# Patient Record
Sex: Female | Born: 1965 | Race: Black or African American | Hispanic: No | Marital: Single | Smoking: Never smoker
Health system: Southern US, Community
[De-identification: ages and names within clinical notes are randomized; demographics above are authoritative.]

## PROBLEM LIST (undated history)

## (undated) DIAGNOSIS — E119 Type 2 diabetes mellitus without complications: Secondary | ICD-10-CM

## (undated) DIAGNOSIS — E785 Hyperlipidemia, unspecified: Secondary | ICD-10-CM

## (undated) HISTORY — DX: Hyperlipidemia, unspecified: E78.5

## (undated) HISTORY — DX: Type 2 diabetes mellitus without complications: E11.9

## (undated) HISTORY — PX: NO PAST SURGERIES: SHX2092

---

## 1998-03-10 ENCOUNTER — Emergency Department (HOSPITAL_COMMUNITY): Admission: EM | Admit: 1998-03-10 | Discharge: 1998-03-10 | Payer: Self-pay | Admitting: Emergency Medicine

## 2004-03-09 ENCOUNTER — Emergency Department (HOSPITAL_COMMUNITY): Admission: EM | Admit: 2004-03-09 | Discharge: 2004-03-09 | Payer: Self-pay | Admitting: Emergency Medicine

## 2004-05-12 ENCOUNTER — Encounter: Admission: RE | Admit: 2004-05-12 | Discharge: 2004-08-10 | Payer: Self-pay | Admitting: Orthopedic Surgery

## 2004-08-18 ENCOUNTER — Encounter: Admission: RE | Admit: 2004-08-18 | Discharge: 2004-10-22 | Payer: Self-pay | Admitting: Orthopedic Surgery

## 2005-01-13 ENCOUNTER — Encounter: Admission: RE | Admit: 2005-01-13 | Discharge: 2005-02-05 | Payer: Self-pay | Admitting: Orthopedic Surgery

## 2005-08-26 ENCOUNTER — Emergency Department (HOSPITAL_COMMUNITY): Admission: EM | Admit: 2005-08-26 | Discharge: 2005-08-26 | Payer: Self-pay | Admitting: Emergency Medicine

## 2006-04-25 ENCOUNTER — Encounter: Admission: RE | Admit: 2006-04-25 | Discharge: 2006-05-30 | Payer: Self-pay | Admitting: Orthopedic Surgery

## 2007-04-26 ENCOUNTER — Emergency Department (HOSPITAL_COMMUNITY): Admission: EM | Admit: 2007-04-26 | Discharge: 2007-04-27 | Payer: Self-pay | Admitting: Emergency Medicine

## 2010-10-09 ENCOUNTER — Emergency Department (HOSPITAL_COMMUNITY)
Admission: EM | Admit: 2010-10-09 | Discharge: 2010-10-09 | Payer: Self-pay | Source: Home / Self Care | Admitting: Emergency Medicine

## 2010-10-13 ENCOUNTER — Inpatient Hospital Stay (HOSPITAL_COMMUNITY)
Admission: EM | Admit: 2010-10-13 | Discharge: 2010-10-17 | Payer: Self-pay | Source: Home / Self Care | Attending: Internal Medicine | Admitting: Internal Medicine

## 2010-10-14 LAB — BASIC METABOLIC PANEL
BUN: 8 mg/dL (ref 6–23)
CO2: 27 mEq/L (ref 19–32)
Calcium: 8.7 mg/dL (ref 8.4–10.5)
Chloride: 106 mEq/L (ref 96–112)
Creatinine, Ser: 0.88 mg/dL (ref 0.4–1.2)
GFR calc Af Amer: >60 mL/min (ref >60)
GFR calc non Af Amer: >60 mL/min (ref >60)
Glucose, Bld: 133 mg/dL — ABNORMAL HIGH (ref 70–99)
Potassium: 3.9 mEq/L (ref 3.5–5.1)
Sodium: 138 mEq/L (ref 135–145)

## 2010-10-14 LAB — HEMOGLOBIN A1C
Hgb A1c MFr Bld: 7.7 % — ABNORMAL HIGH (ref <5.7)
Mean Plasma Glucose: 174 mg/dL — ABNORMAL HIGH (ref <117)

## 2010-10-14 LAB — CBC
HCT: 39.2 % (ref 36.0–46.0)
Hemoglobin: 12.6 g/dL (ref 12.0–15.0)
MCH: 26.7 pg (ref 26.0–34.0)
MCHC: 32.1 g/dL (ref 30.0–36.0)
MCV: 83.1 fL (ref 78.0–100.0)
Platelets: 281 10*3/uL (ref 150–400)
RBC: 4.72 MIL/uL (ref 3.87–5.11)
RDW: 13.5 % (ref 11.5–15.5)
WBC: 5.9 10*3/uL (ref 4.0–10.5)

## 2010-10-14 LAB — DIFFERENTIAL
Basophils Absolute: 0 10*3/uL (ref 0.0–0.1)
Basophils Relative: 0 % (ref 0–1)
Eosinophils Absolute: 0.1 10*3/uL (ref 0.0–0.7)
Eosinophils Relative: 2 % (ref 0–5)
Lymphocytes Relative: 28 % (ref 12–46)
Lymphs Abs: 1.7 10*3/uL (ref 0.7–4.0)
Monocytes Absolute: 0.3 10*3/uL (ref 0.1–1.0)
Monocytes Relative: 5 % (ref 3–12)
Neutro Abs: 3.9 10*3/uL (ref 1.7–7.7)
Neutrophils Relative %: 65 % (ref 43–77)

## 2010-10-14 LAB — LIPID PANEL
Cholesterol: 144 mg/dL (ref 0–200)
HDL: 37 mg/dL — ABNORMAL LOW (ref >39)
LDL Cholesterol: 87 mg/dL (ref 0–99)
Total CHOL/HDL Ratio: 3.9 RATIO
Triglycerides: 99 mg/dL (ref <150)
VLDL: 20 mg/dL (ref 0–40)

## 2010-10-14 LAB — TSH: TSH: 0.982 u[IU]/mL (ref 0.350–4.500)

## 2010-10-15 LAB — GLUCOSE, CAPILLARY
Glucose-Capillary: 98 mg/dL (ref 70–99)
Glucose-Capillary: 114 mg/dL — ABNORMAL HIGH (ref 70–99)

## 2010-10-16 LAB — COMPREHENSIVE METABOLIC PANEL
ALT: 16 U/L (ref 0–35)
AST: 18 U/L (ref 0–37)
Albumin: 3.2 g/dL — ABNORMAL LOW (ref 3.5–5.2)
Alkaline Phosphatase: 44 U/L (ref 39–117)
BUN: 4 mg/dL — ABNORMAL LOW (ref 6–23)
CO2: 20 mEq/L (ref 19–32)
Calcium: 8.5 mg/dL (ref 8.4–10.5)
Chloride: 104 mEq/L (ref 96–112)
Creatinine, Ser: 0.7 mg/dL (ref 0.4–1.2)
GFR calc Af Amer: >60 mL/min (ref >60)
GFR calc non Af Amer: >60 mL/min (ref >60)
Glucose, Bld: 149 mg/dL — ABNORMAL HIGH (ref 70–99)
Potassium: 3.3 mEq/L — ABNORMAL LOW (ref 3.5–5.1)
Sodium: 136 mEq/L (ref 135–145)
Total Bilirubin: 0.4 mg/dL (ref 0.3–1.2)
Total Protein: 6.7 g/dL (ref 6.0–8.3)

## 2010-10-16 LAB — CBC
HCT: 40.6 % (ref 36.0–46.0)
Hemoglobin: 13.5 g/dL (ref 12.0–15.0)
MCH: 27.4 pg (ref 26.0–34.0)
MCHC: 33.3 g/dL (ref 30.0–36.0)
MCV: 82.5 fL (ref 78.0–100.0)
Platelets: 284 10*3/uL (ref 150–400)
RBC: 4.92 MIL/uL (ref 3.87–5.11)
RDW: 13.4 % (ref 11.5–15.5)
WBC: 5.1 10*3/uL (ref 4.0–10.5)

## 2010-10-16 LAB — GLUCOSE, CAPILLARY
Glucose-Capillary: 133 mg/dL — ABNORMAL HIGH (ref 70–99)
Glucose-Capillary: 80 mg/dL (ref 70–99)

## 2010-10-16 LAB — APTT: aPTT: 27 seconds (ref 24–37)

## 2010-10-16 LAB — PROTIME-INR
INR: 0.99 (ref 0.00–1.49)
Prothrombin Time: 13.3 seconds (ref 11.6–15.2)

## 2010-10-16 LAB — MAGNESIUM: Magnesium: 1.8 mg/dL (ref 1.5–2.5)

## 2010-10-26 LAB — GLUCOSE, CAPILLARY
Glucose-Capillary: 134 mg/dL — ABNORMAL HIGH (ref 70–99)
Glucose-Capillary: 128 mg/dL — ABNORMAL HIGH (ref 70–99)

## 2010-12-21 LAB — CBC
HCT: 40.2 % (ref 36.0–46.0)
HCT: 44 % (ref 36.0–46.0)
Hemoglobin: 14.7 g/dL (ref 12.0–15.0)
MCH: 26.6 pg (ref 26.0–34.0)
MCHC: 33.4 g/dL (ref 30.0–36.0)
MCV: 82.4 fL (ref 78.0–100.0)
RBC: 5.31 MIL/uL — ABNORMAL HIGH (ref 3.87–5.11)
RDW: 13.5 % (ref 11.5–15.5)
WBC: 7.3 10*3/uL (ref 4.0–10.5)

## 2010-12-21 LAB — COMPREHENSIVE METABOLIC PANEL
ALT: 16 U/L (ref 0–35)
ALT: 19 U/L (ref 0–35)
AST: 26 U/L (ref 0–37)
Albumin: 3.6 g/dL (ref 3.5–5.2)
Alkaline Phosphatase: 50 U/L (ref 39–117)
Alkaline Phosphatase: 56 U/L (ref 39–117)
BUN: 9 mg/dL (ref 6–23)
CO2: 27 mEq/L (ref 19–32)
Calcium: 9.4 mg/dL (ref 8.4–10.5)
Chloride: 101 mEq/L (ref 96–112)
GFR calc Af Amer: >60 mL/min (ref >60)
GFR calc Af Amer: >60 mL/min (ref >60)
GFR calc non Af Amer: >60 mL/min (ref >60)
Glucose, Bld: 199 mg/dL — ABNORMAL HIGH (ref 70–99)
Sodium: 136 mEq/L (ref 135–145)
Total Bilirubin: 0.4 mg/dL (ref 0.3–1.2)
Total Bilirubin: 1 mg/dL (ref 0.3–1.2)

## 2010-12-21 LAB — DIFFERENTIAL
Basophils Absolute: 0 10*3/uL (ref 0.0–0.1)
Basophils Relative: 0 % (ref 0–1)
Eosinophils Absolute: 0.1 10*3/uL (ref 0.0–0.7)
Eosinophils Absolute: 0.2 10*3/uL (ref 0.0–0.7)
Eosinophils Relative: 2 % (ref 0–5)
Eosinophils Relative: 2 % (ref 0–5)
Lymphocytes Relative: 33 % (ref 12–46)
Monocytes Relative: 5 % (ref 3–12)
Neutro Abs: 4.3 10*3/uL (ref 1.7–7.7)
Neutrophils Relative %: 59 % (ref 43–77)
Neutrophils Relative %: 69 % (ref 43–77)

## 2010-12-21 LAB — URINALYSIS, ROUTINE W REFLEX MICROSCOPIC
Bilirubin Urine: NEGATIVE
Bilirubin Urine: NEGATIVE
Glucose, UA: 100 mg/dL — AB
Glucose, UA: 250 mg/dL — AB
Ketones, ur: NEGATIVE mg/dL
Nitrite: NEGATIVE
Nitrite: NEGATIVE
Protein, ur: NEGATIVE mg/dL
Specific Gravity, Urine: 1.02 (ref 1.005–1.030)
Urobilinogen, UA: 0.2 mg/dL (ref 0.0–1.0)
pH: 6.5 (ref 5.0–8.0)

## 2010-12-21 LAB — LIPASE, BLOOD: Lipase: 25 U/L (ref 11–59)

## 2010-12-21 LAB — POCT PREGNANCY, URINE: Preg Test, Ur: NEGATIVE

## 2010-12-21 LAB — URINE MICROSCOPIC-ADD ON

## 2011-07-26 LAB — URINALYSIS, ROUTINE W REFLEX MICROSCOPIC
Bilirubin Urine: NEGATIVE
Ketones, ur: NEGATIVE
Leukocytes, UA: NEGATIVE
Nitrite: POSITIVE — AB
Protein, ur: NEGATIVE
Urobilinogen, UA: 1
pH: 6

## 2011-07-26 LAB — URINE MICROSCOPIC-ADD ON

## 2011-07-27 IMAGING — US US ABDOMEN COMPLETE
1 series · 14 of 25 positions shown · non-contrast
Comparison: none

[Series 1: us abdomen complete · 0.31mm/px · 14 of 69 slices shown]
[im 1/69]
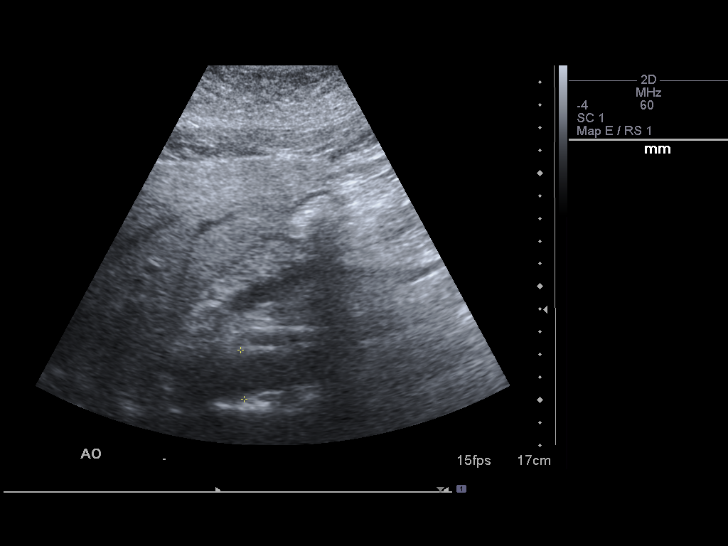
[im 6/69]
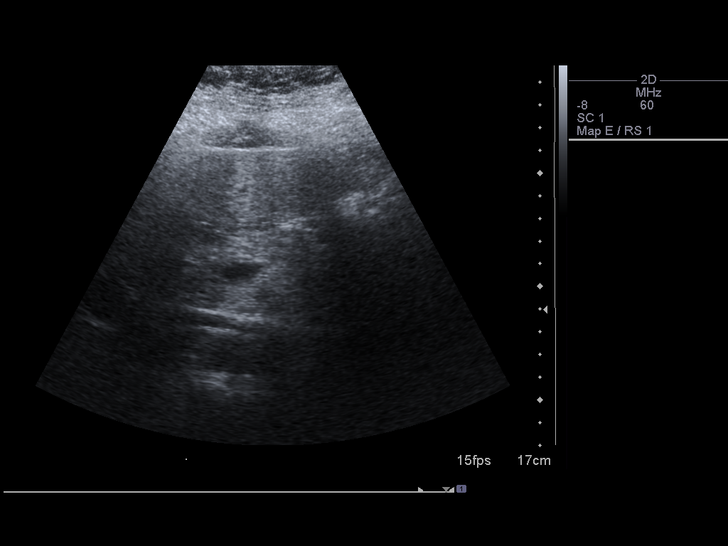
[im 12/69]
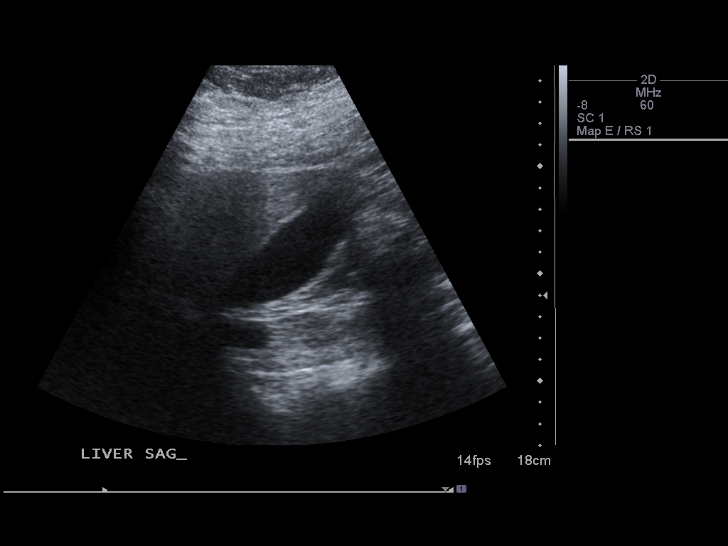
[im 18/69]
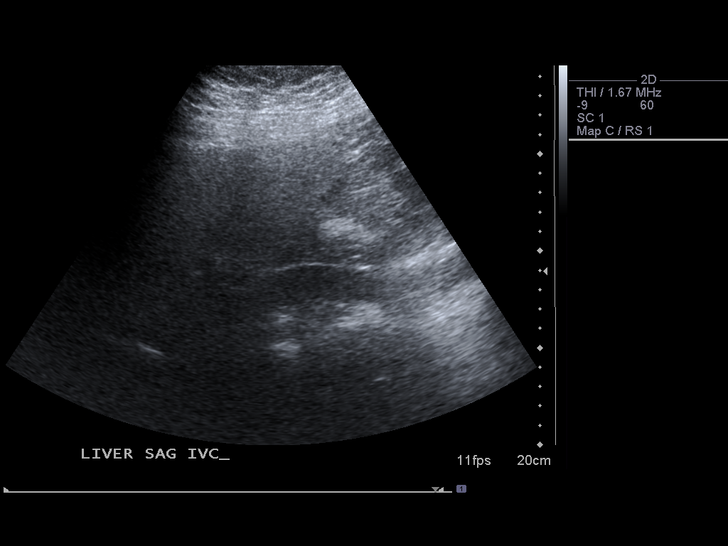
[im 23/69]
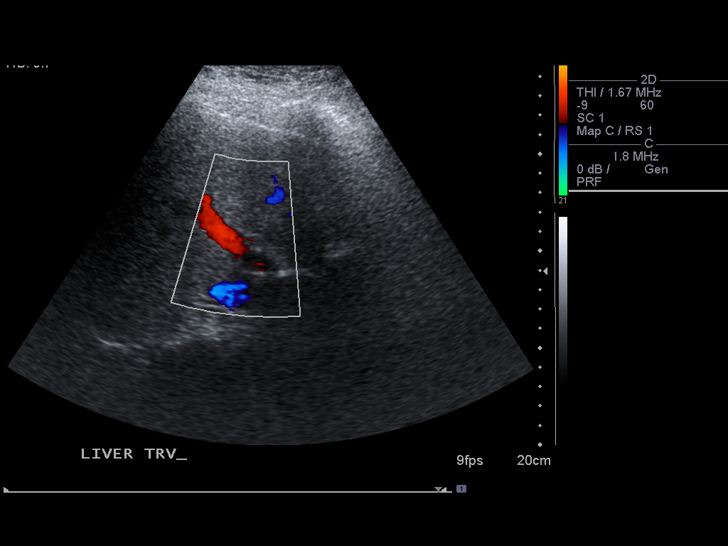
[im 26/69]
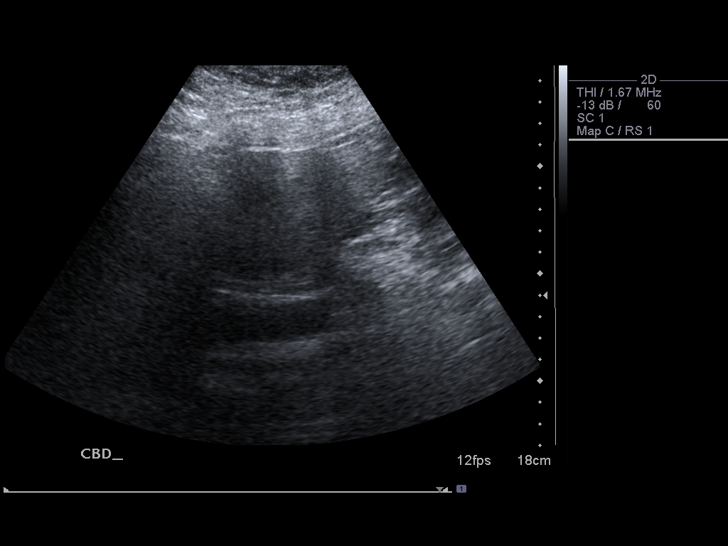
[im 32/69]
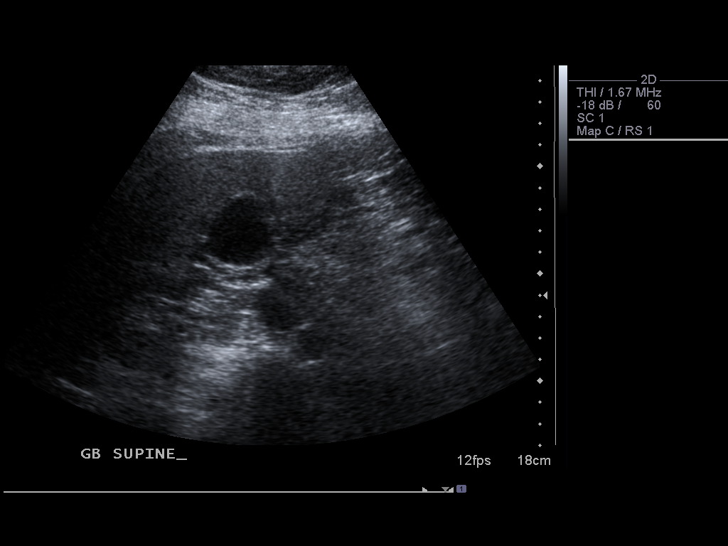
[im 37/69]
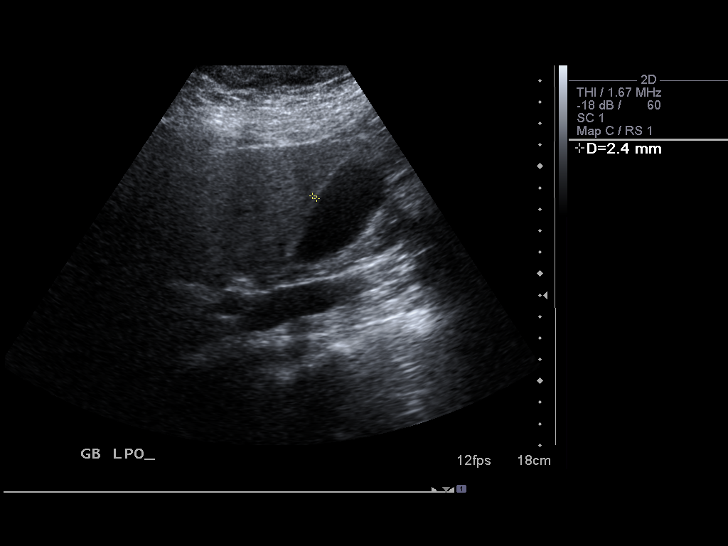
[im 43/69]
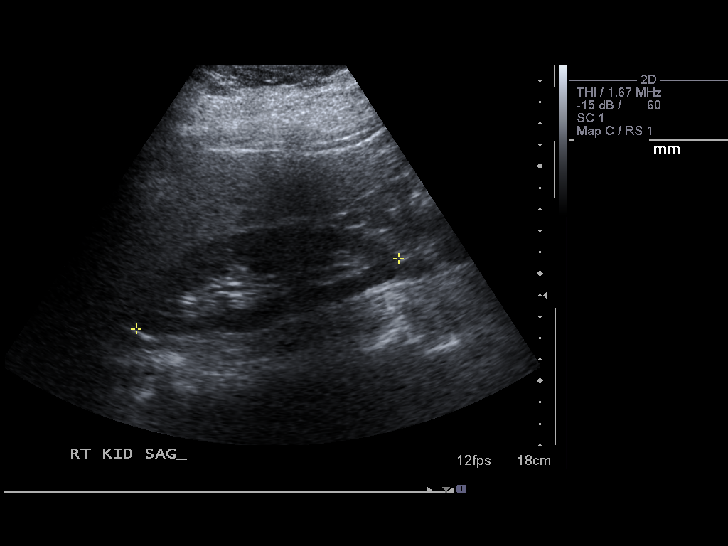
[im 46/69]
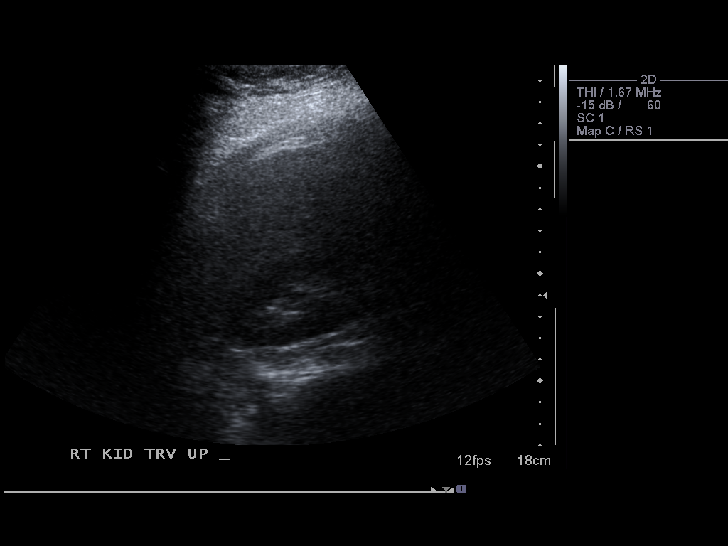
[im 52/69]
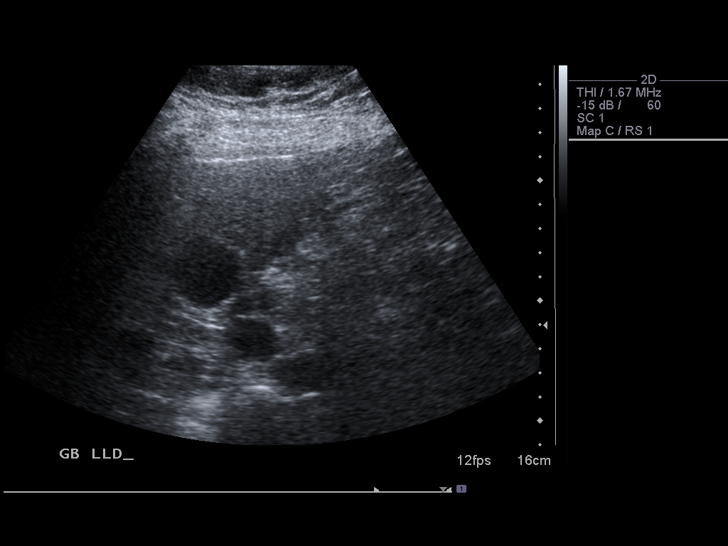
[im 57/69]
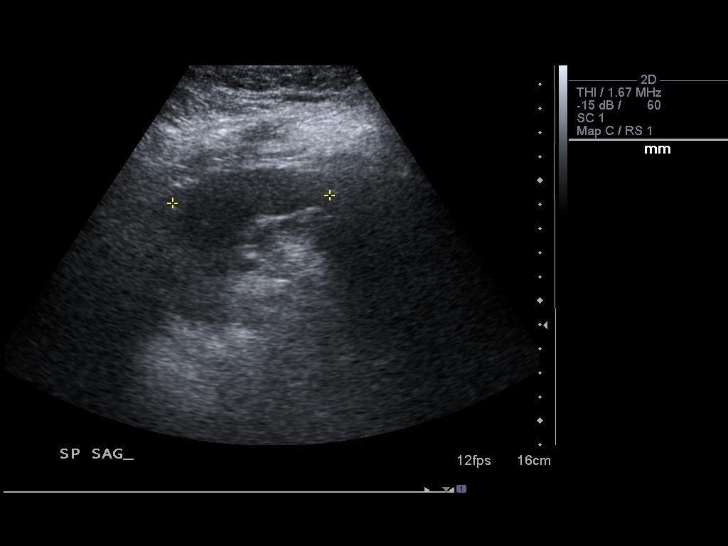
[im 63/69]
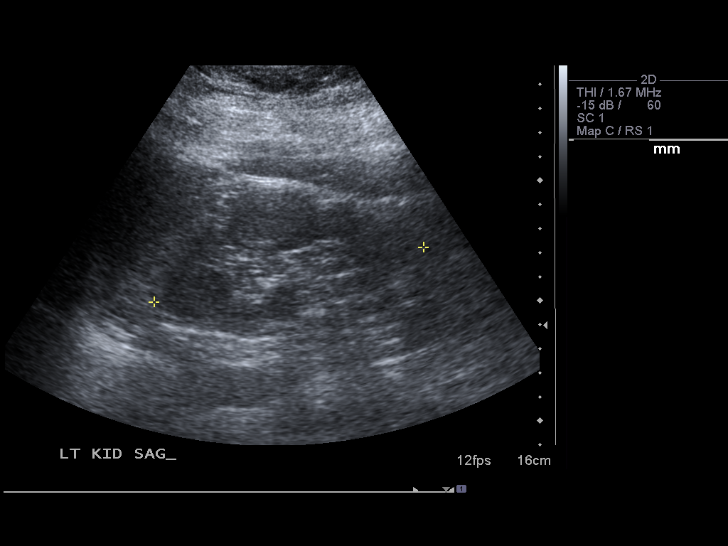
[im 69/69]
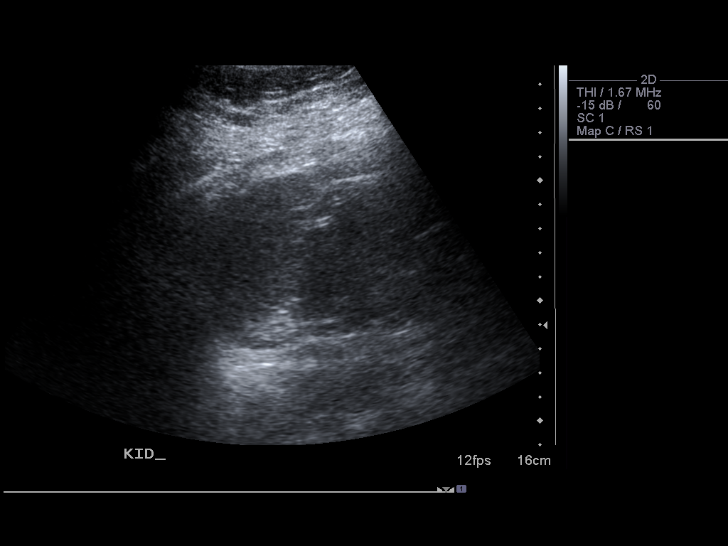

[14 of 25 positions shown; findings below may reference images not displayed]

Canned report from images found in remote index.

Refer to host system for actual result text.

## 2011-07-27 IMAGING — CR DG CHEST 2V
2 series · 2 of 2 positions shown · non-contrast
Comparison: None

CLINICAL DATA: Pain.

CHEST - 2 VIEW

[w chest pa]
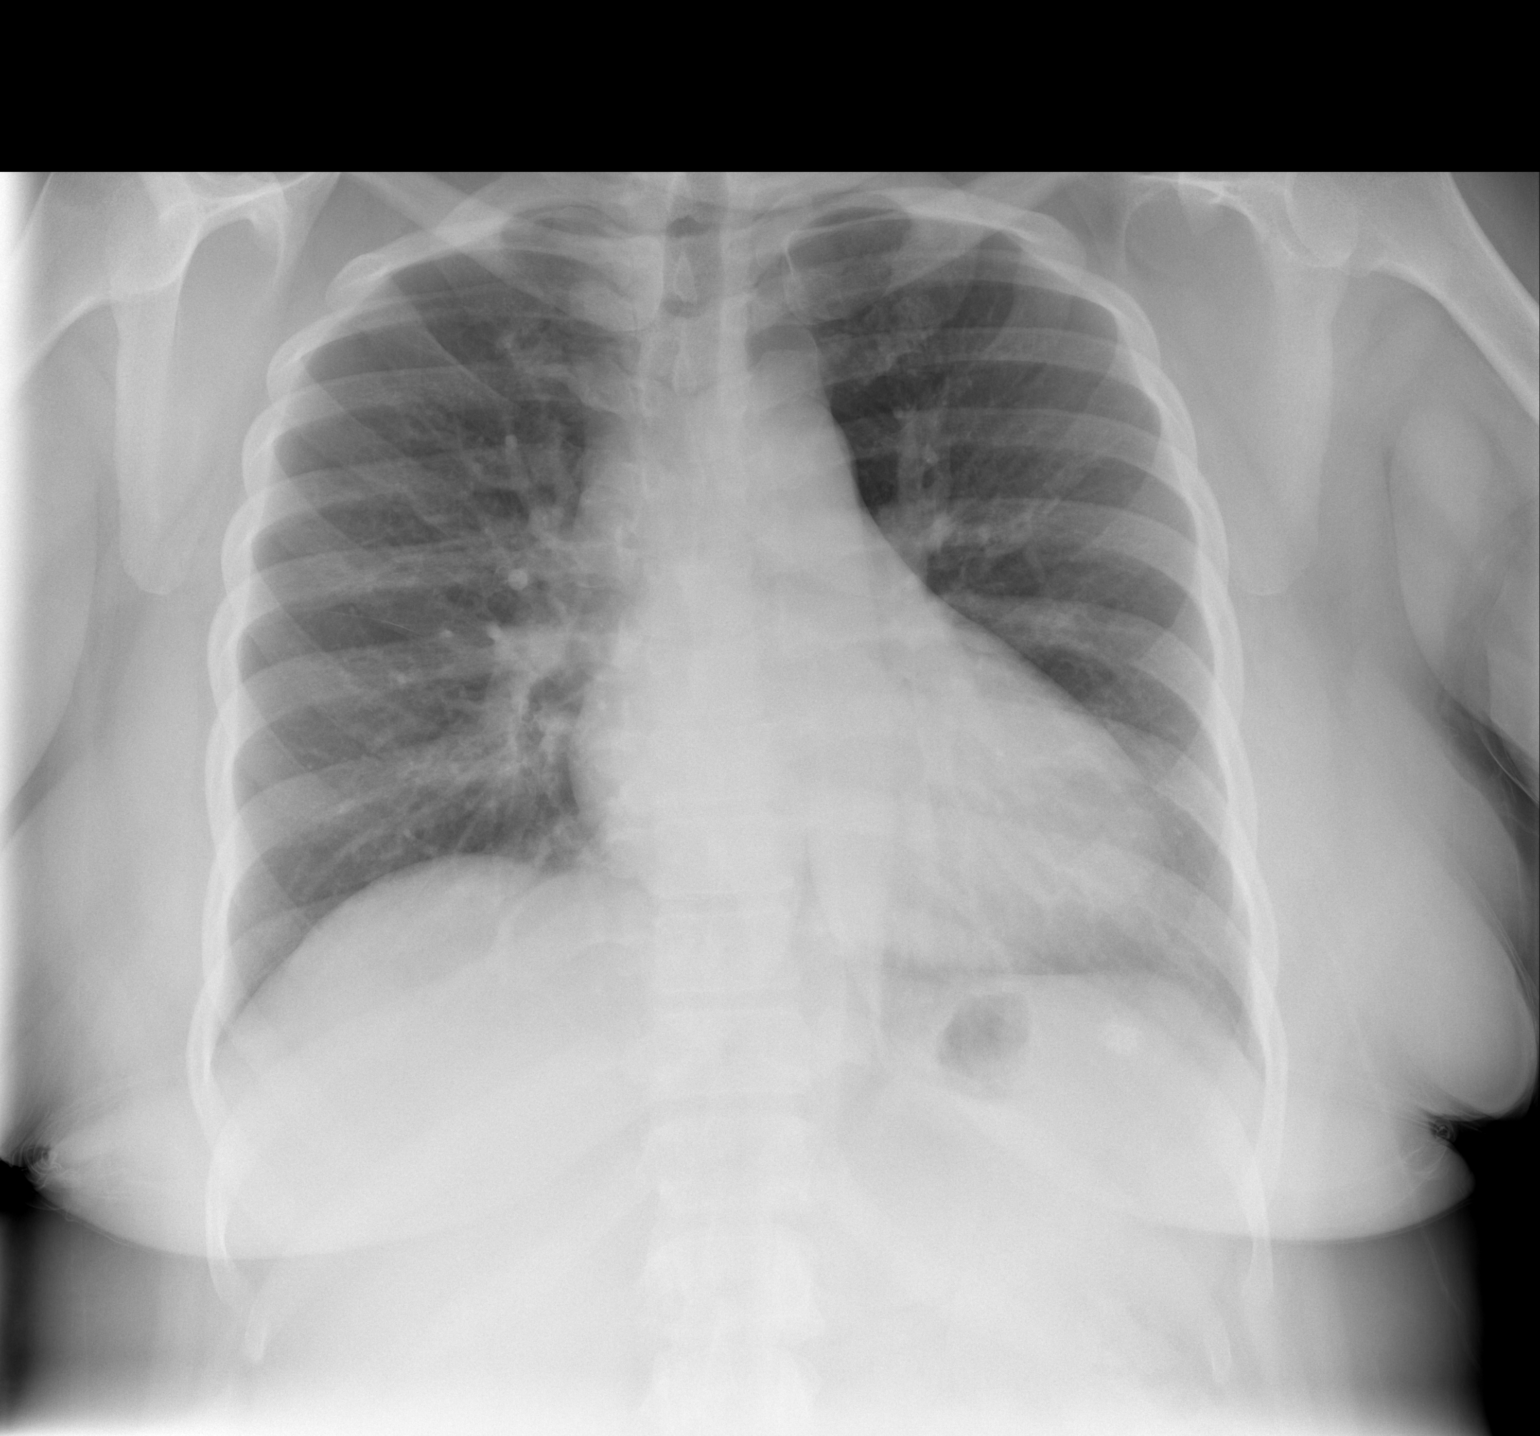

[w chest lat]
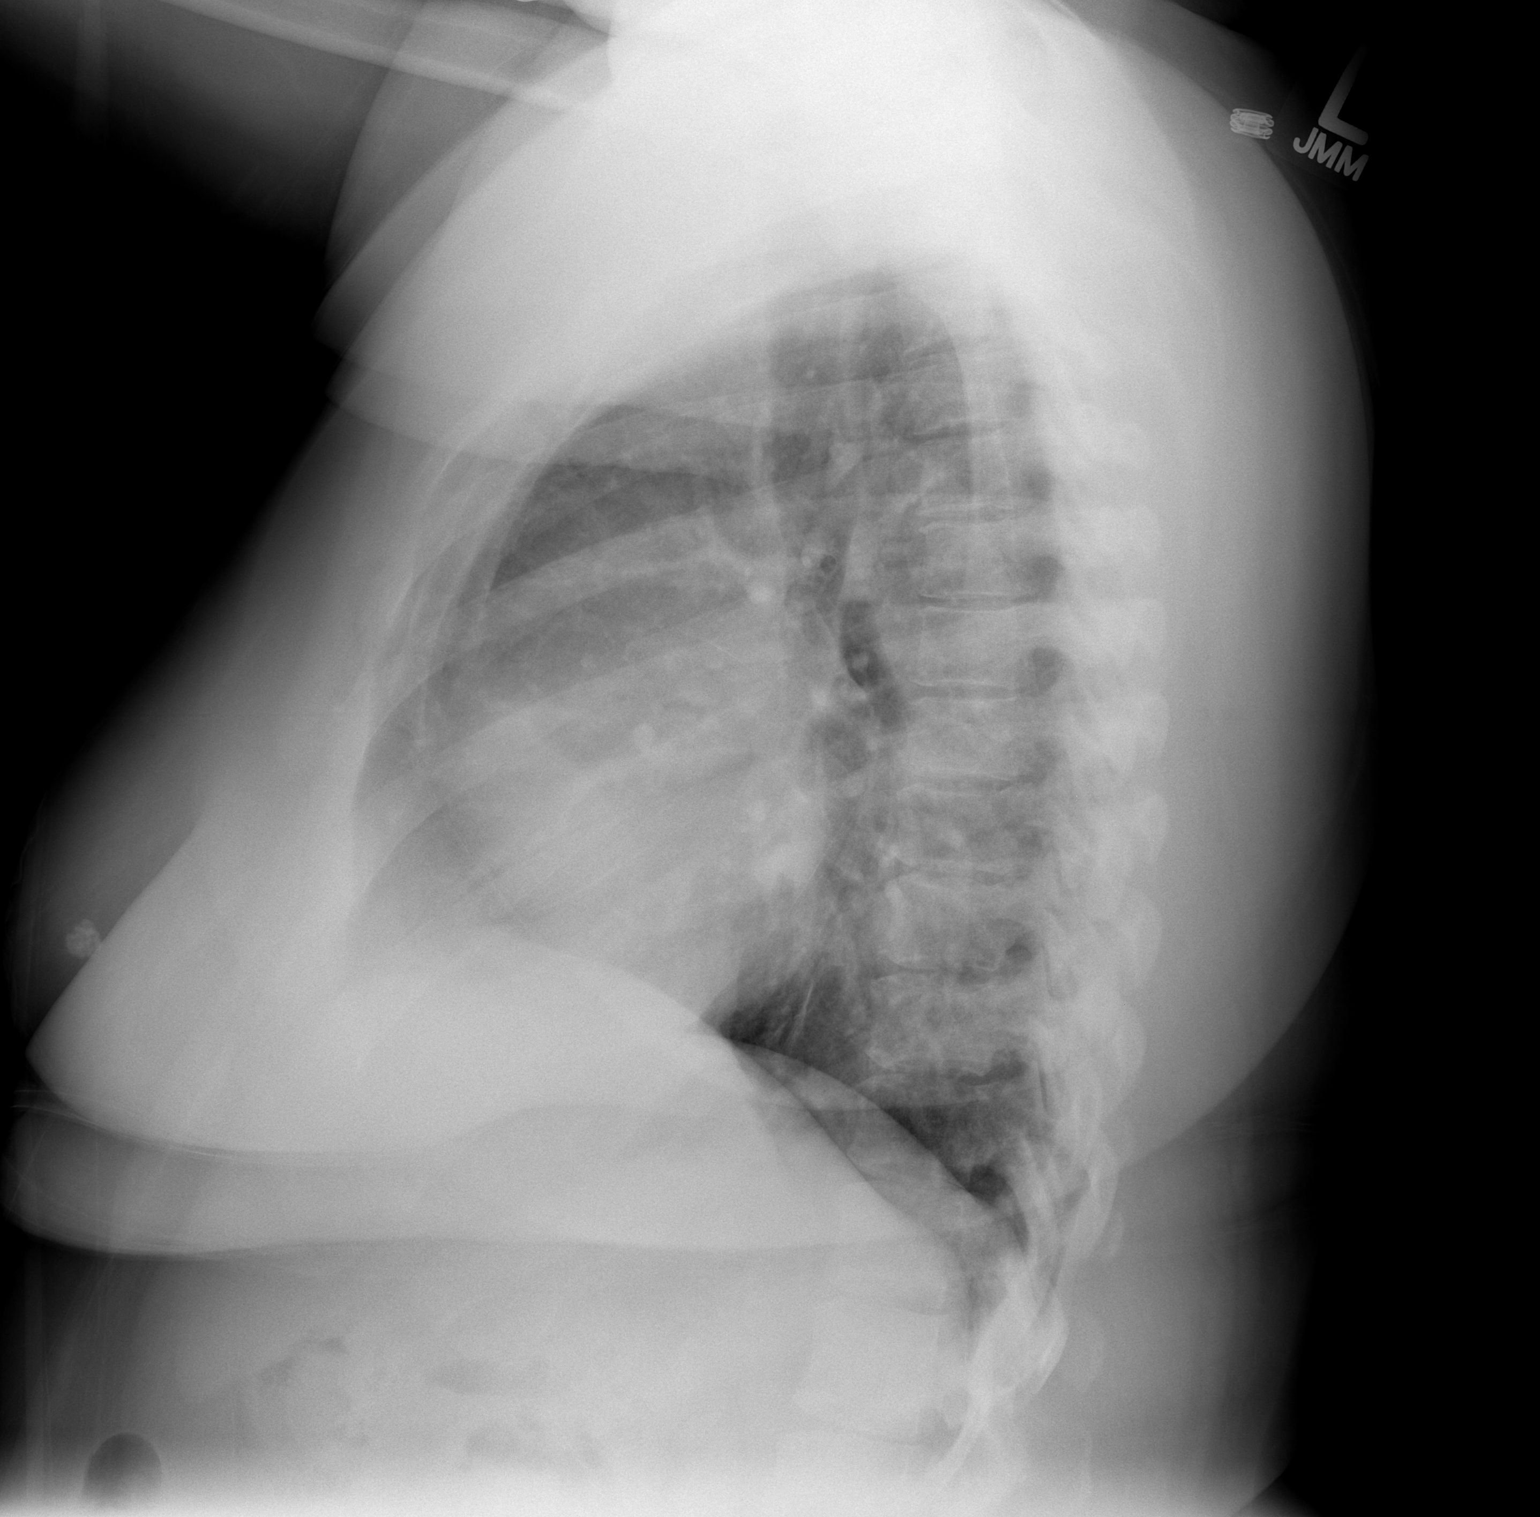

[2 of 2 positions shown; findings below may reference images not displayed]

FINDINGS: Mild cardiomegaly is noted.
Mild peribronchial thickening is present.
There is no evidence of focal airspace disease, pulmonary edema,
pulmonary nodule/mass, pleural effusion, or pneumothorax.
No acute bony abnormalities are identified.
The visualized upper abdomen is unremarkable.
IMPRESSION: Mild cardiomegaly without evidence of acute cardiopulmonary
disease.

## 2011-10-09 ENCOUNTER — Emergency Department (INDEPENDENT_AMBULATORY_CARE_PROVIDER_SITE_OTHER): Payer: Medicaid Other

## 2011-10-09 ENCOUNTER — Emergency Department (INDEPENDENT_AMBULATORY_CARE_PROVIDER_SITE_OTHER)
Admission: EM | Admit: 2011-10-09 | Discharge: 2011-10-09 | Disposition: A | Discharge status: Home / Self Care | Payer: Medicaid Other | Source: Home / Self Care | Attending: Emergency Medicine | Admitting: Emergency Medicine

## 2011-10-09 ENCOUNTER — Encounter: Payer: Self-pay | Admitting: *Deleted

## 2011-10-09 DIAGNOSIS — R6889 Other general symptoms and signs: Secondary | ICD-10-CM

## 2011-10-09 DIAGNOSIS — J111 Influenza due to unidentified influenza virus with other respiratory manifestations: Secondary | ICD-10-CM

## 2011-10-09 MED ORDER — HYDROCODONE-ACETAMINOPHEN 7.5-500 MG/15ML PO SOLN: 5.0000 mL | Freq: Four times a day (QID) | ORAL | Status: AC | PRN

## 2011-10-09 MED ORDER — FLUTICASONE PROPIONATE 50 MCG/ACT NA SUSP: 2.0000 | Freq: Every day | NASAL | Status: DC

## 2011-10-09 MED ORDER — IBUPROFEN 600 MG PO TABS: 600.0000 mg | ORAL_TABLET | Freq: Four times a day (QID) | ORAL | Status: AC | PRN

## 2011-10-09 MED ORDER — PSEUDOEPHEDRINE-GUAIFENESIN ER 120-1200 MG PO TB12: 1.0000 | ORAL_TABLET | Freq: Two times a day (BID) | ORAL | Status: DC | PRN

## 2011-10-09 NOTE — ED Notes (Signed)
Pt is here with complaints of non productive cough, fever, HA, and sore throat X "a few days."  Pt reports her chest hurts when she coughs and moves around.

## 2011-10-09 NOTE — ED Provider Notes (Signed)
History     CSN: [id number]  Arrival date & time 10/09/11  1459   First MD Initiated Contact with Patient 10/09/11 1500      Chief Complaint  Patient presents with  . Cough  . Fever  . Headache     HPI Comments:  Flu symptoms for about 3-4 day. Fever to low 100's with chills, sweats, myalgias, fatigue, headache,   ST, nonproductive cough. Reports SOB. Unable to sleep at night secondary to coughing. No ear pain, abd pain, wheeze,  rash, N/V. Slightly decreased appetite but is tolerating po.   Symptoms are progressively worsening, despite trying OTC fever reducing medicine and rest and fluids. Has decreased appetite, but tolerating some liquids by mouth. Got flu shot this year  Review of Systems: Positive for fatigue, mild nasal congestion, mild sore throat, mild swollen anterior neck glands, mild cough. Negative for acute vision changes, stiff neck, focal weakness, syncope, seizures, respiratory distress, vomiting, diarrhea, GU symptoms.      Patient is a 45 y.o. female presenting with cough, fever, and headaches. The history is provided by the patient and the spouse.  Cough This is a new problem. The current episode started more than 2 days ago. The problem has not changed since onset.The cough is non-productive. The maximum temperature recorded prior to her arrival was 100 to 100.9 F. Pertinent negatives include no sweats, no weight loss and no ear congestion. She has tried decongestants for the symptoms. The treatment provided no relief. She is not a smoker. Her past medical history does not include asthma.  Fever  Headache    History reviewed. No pertinent past medical history.  History reviewed. No pertinent past surgical history.  History reviewed. No pertinent family history.  History  Substance Use Topics  . Smoking status: Never Smoker   . Smokeless tobacco: Not on file  . Alcohol Use: No    OB History    Grav Para Term Preterm Abortions TAB SAB Ect Mult  Living                  Review of Systems  Constitutional: Negative for weight loss.  Gastrointestinal: Negative.   Genitourinary: Negative.   Skin: Negative.   All other systems reviewed and are negative.    Allergies  Penicillins  Home Medications   Current Outpatient Rx  Name Route Sig Dispense Refill  . FLUTICASONE PROPIONATE 50 MCG/ACT NA SUSP Nasal Place 2 sprays into the nose daily. 16 g 2  . HYDROCODONE-ACETAMINOPHEN 7.5-500 MG/15ML PO SOLN Oral Take 5 mLs by mouth every 6 (six) hours as needed for pain. 120 mL 0  . IBUPROFEN 600 MG PO TABS Oral Take 1 tablet (600 mg total) by mouth every 6 (six) hours as needed for pain. 30 tablet 0  . PSEUDOEPHEDRINE-GUAIFENESIN [id number] MG PO TB12 Oral Take 1 tablet by mouth 2 (two) times daily as needed (congestion). 20 each 0    BP 136/85  Pulse 118  Temp(Src) 101.6 F (38.7 C) (Oral)  Resp 16  SpO2 100%  LMP 10/06/2011  Physical Exam  Nursing note and vitals reviewed. Constitutional: She is oriented to person, place, and time. She appears well-developed and well-nourished.  HENT:  Head: Normocephalic and atraumatic.  Right Ear: Tympanic membrane normal.  Left Ear: Tympanic membrane normal.  Nose: Mucosal edema and rhinorrhea present. Right sinus exhibits no maxillary sinus tenderness and no frontal sinus tenderness. Left sinus exhibits no maxillary sinus tenderness and no frontal sinus tenderness.  Mouth/Throat:  Uvula is midline and oropharynx is clear and moist.  Eyes: Conjunctivae and EOM are normal. Pupils are equal, round, and reactive to light.  Neck: Normal range of motion. Neck supple.  Cardiovascular: Regular rhythm, normal heart sounds and intact distal pulses.   No murmur heard. Pulmonary/Chest: Effort normal and breath sounds normal. No respiratory distress. She has no wheezes. She has no rales. She exhibits tenderness.  Abdominal: Soft. Bowel sounds are normal. She exhibits no distension and no mass. There is  no tenderness. There is no rebound, no guarding and no CVA tenderness.  Musculoskeletal: Normal range of motion. She exhibits no edema and no tenderness.  Lymphadenopathy:    She has no cervical adenopathy.  Neurological: She is alert and oriented to person, place, and time.  Skin: Skin is warm and dry.  Psychiatric: She has a normal mood and affect. Her behavior is normal. Judgment and thought content normal.    ED Course  Procedures (including critical care time)  Labs Reviewed - No data to display Dg Chest 2 View  10/09/2011  *RADIOLOGY REPORT*  Clinical Data: Cough, fever  CHEST - 2 VIEW  Comparison: 04/26/2007  Findings: Cardiomediastinal silhouette is stable.  No acute infiltrate or pleural effusion.  No pulmonary edema.  Minimal central increased bronchial markings without focal consolidation. Bony thorax is stable.  IMPRESSION: No acute infiltrate or pulmonary edema.  Minimal central increased bronchial markings without focal consolidation.  Original Report Authenticated By: Natasha Mead, M.D.     1. Flu-like symptoms       MDM  Checking CXR as has SOB, CP to r/o PNA. If neg txing as flu. Imaging reviewed by myself. Report per radiologist. Has had sx for >72 hrs. Tamiflu not indicated. Discussed this with family.   On re-evaluation,  discussed imaging  results with patient/family. Emphasized importance of f/u. patient/family agrees.          Luiz Blare, MD 10/09/11 [phone]

## 2012-05-09 ENCOUNTER — Encounter (HOSPITAL_COMMUNITY): Payer: Self-pay

## 2012-05-09 ENCOUNTER — Emergency Department (HOSPITAL_COMMUNITY)
Admission: EM | Admit: 2012-05-09 | Discharge: 2012-05-09 | Disposition: A | Discharge status: Home / Self Care | Payer: Medicaid Other | Attending: Emergency Medicine | Admitting: Emergency Medicine

## 2012-05-09 ENCOUNTER — Emergency Department (HOSPITAL_COMMUNITY): Payer: Medicaid Other

## 2012-05-09 DIAGNOSIS — R739 Hyperglycemia, unspecified: Secondary | ICD-10-CM

## 2012-05-09 DIAGNOSIS — N390 Urinary tract infection, site not specified: Secondary | ICD-10-CM

## 2012-05-09 DIAGNOSIS — R7309 Other abnormal glucose: Secondary | ICD-10-CM | POA: Insufficient documentation

## 2012-05-09 DIAGNOSIS — R0602 Shortness of breath: Secondary | ICD-10-CM | POA: Insufficient documentation

## 2012-05-09 DIAGNOSIS — R51 Headache: Secondary | ICD-10-CM | POA: Insufficient documentation

## 2012-05-09 DIAGNOSIS — R079 Chest pain, unspecified: Secondary | ICD-10-CM

## 2012-05-09 DIAGNOSIS — H8110 Benign paroxysmal vertigo, unspecified ear: Secondary | ICD-10-CM

## 2012-05-09 LAB — COMPREHENSIVE METABOLIC PANEL
ALT: 17 U/L (ref 0–35)
AST: 11 U/L (ref 0–37)
Alkaline Phosphatase: 66 U/L (ref 39–117)
CO2: 22 mEq/L (ref 19–32)
Chloride: 96 mEq/L (ref 96–112)
GFR calc non Af Amer: >90 mL/min (ref >90)
Sodium: 130 mEq/L — ABNORMAL LOW (ref 135–145)
Total Bilirubin: 0.5 mg/dL (ref 0.3–1.2)

## 2012-05-09 LAB — URINALYSIS, ROUTINE W REFLEX MICROSCOPIC
Glucose, UA: >1000 mg/dL — AB
Protein, ur: 100 mg/dL — AB
Specific Gravity, Urine: 1.038 — ABNORMAL HIGH (ref 1.005–1.030)
Urobilinogen, UA: 0.2 mg/dL (ref 0.0–1.0)

## 2012-05-09 LAB — CBC WITH DIFFERENTIAL/PLATELET
Basophils Absolute: 0 10*3/uL (ref 0.0–0.1)
HCT: 43 % (ref 36.0–46.0)
Lymphocytes Relative: 18 % (ref 12–46)
Lymphs Abs: 1.9 10*3/uL (ref 0.7–4.0)
Neutro Abs: 8.2 10*3/uL — ABNORMAL HIGH (ref 1.7–7.7)
Platelets: 279 10*3/uL (ref 150–400)
RBC: 5.26 MIL/uL — ABNORMAL HIGH (ref 3.87–5.11)
RDW: 13.1 % (ref 11.5–15.5)
WBC: 10.7 10*3/uL — ABNORMAL HIGH (ref 4.0–10.5)

## 2012-05-09 LAB — URINE MICROSCOPIC-ADD ON

## 2012-05-09 LAB — D-DIMER, QUANTITATIVE: D-Dimer, Quant: 0.87 ug/mL-FEU — ABNORMAL HIGH (ref 0.00–0.48)

## 2012-05-09 MED ORDER — MECLIZINE HCL 25 MG PO TABS
25.0000 mg | ORAL_TABLET | Freq: Once | ORAL | Status: AC
  Administered 2012-05-09: 25 mg via ORAL
  Filled 2012-05-09: qty 1

## 2012-05-09 MED ORDER — MECLIZINE HCL 12.5 MG PO TABS: 12.5000 mg | ORAL_TABLET | Freq: Three times a day (TID) | ORAL | Status: AC | PRN

## 2012-05-09 MED ORDER — SULFAMETHOXAZOLE-TRIMETHOPRIM 800-160 MG PO TABS: 1.0000 | ORAL_TABLET | Freq: Two times a day (BID) | ORAL | Status: AC

## 2012-05-09 MED ORDER — IOHEXOL 350 MG/ML SOLN
100.0000 mL | Freq: Once | INTRAVENOUS | Status: AC | PRN
  Administered 2012-05-09: 100 mL via INTRAVENOUS

## 2012-05-09 NOTE — ED Provider Notes (Signed)
History     CSN: [id number]  Arrival date & time 05/09/12  1100   First MD Initiated Contact with Patient 05/09/12 1138      Chief Complaint  Patient presents with  . Dizziness  . Headache  . Abdominal Pain    (Consider location/radiation/quality/duration/timing/severity/associated sxs/prior treatment) Patient is a 46 y.o. female presenting with neurologic complaint and chest pain. The history is provided by the patient.  Neurologic Problem The primary symptoms include headaches and dizziness. Primary symptoms do not include syncope, loss of consciousness, altered mental status, visual change, paresthesias, focal weakness, loss of sensation, speech change, fever, nausea or vomiting. The symptoms began 2 days ago. The episode lasted 5 minutes. The symptoms are waxing and waning. The symptoms occurred after standing up.  The pain from the headache is at a severity of 5/10. Location/region(s) of the headache: frontal. The headache is not associated with aura, photophobia, eye pain, visual change, neck stiffness, paresthesias, weakness or loss of balance.  She describes the dizziness as a sensation of spinning. Progression since onset: waxing and waning. It is a new problem. Dizziness does not occur with tinnitus, hearing loss, nausea, vomiting, weakness or diaphoresis.  Additional symptoms include vertigo. Additional symptoms do not include neck stiffness, weakness, loss of balance, photophobia, aura, nystagmus or tinnitus. Medical issues do not include diabetes or hypertension.  Chest Pain The chest pain began yesterday. Duration of episode(s) is 5 minutes. Chest pain occurs intermittently. The chest pain is unchanged. At its most intense, the pain is at 7/10. The pain is currently at 0/10. The quality of the pain is described as sharp. The pain does not radiate (migrates to abdomen). Primary symptoms include abdominal pain and dizziness. Pertinent negatives for primary symptoms include no  fever, no syncope, no cough, no palpitations, no nausea, no vomiting and no altered mental status.  Dizziness does not occur with tinnitus, hearing loss, nausea, vomiting, weakness or diaphoresis.  Pertinent negatives for associated symptoms include no claudication, no diaphoresis, no lower extremity edema, no near-syncope, no numbness and no weakness. Risk factors include no known risk factors.  Pertinent negatives for past medical history include no CAD, no diabetes, no DVT and no hypertension. Family history comments: unknown family history     No past medical history on file.  No past surgical history on file.  No family history on file.  History  Substance Use Topics  . Smoking status: Never Smoker   . Smokeless tobacco: Not on file  . Alcohol Use: No    OB History    Grav Para Term Preterm Abortions TAB SAB Ect Mult Living                  Review of Systems  Constitutional: Negative for fever and diaphoresis.  HENT: Negative for neck stiffness, sinus pressure and tinnitus.   Eyes: Negative for photophobia and pain.  Respiratory: Negative for cough.   Cardiovascular: Positive for chest pain. Negative for palpitations, claudication, syncope and near-syncope.  Gastrointestinal: Positive for abdominal pain. Negative for nausea and vomiting.  Genitourinary: Negative for dysuria and frequency.  Musculoskeletal: Negative for back pain.  Skin: Negative for pallor and wound.  Neurological: Positive for dizziness, vertigo and headaches. Negative for speech change, focal weakness, loss of consciousness, speech difficulty, weakness, light-headedness, numbness, paresthesias and loss of balance.  Psychiatric/Behavioral: Negative for confusion and altered mental status.  All other systems reviewed and are negative.    Allergies  Penicillins  Home Medications  Current Outpatient Rx  Name Route Sig Dispense Refill  . DIPHENHYDRAMINE HCL 25 MG PO TABS Oral Take 50 mg by mouth  every 6 (six) hours as needed. For allergies    . FLUTICASONE PROPIONATE 50 MCG/ACT NA SUSP Nasal Place 2 sprays into the nose daily. 16 g 2    BP 137/87  Pulse 104  Temp 98.4 F (36.9 C) (Oral)  Resp 21  SpO2 97%  LMP 04/12/2012  Physical Exam  Nursing note and vitals reviewed. Constitutional: She is oriented to person, place, and time. She appears well-developed and well-nourished.  HENT:  Head: Normocephalic and atraumatic.  Eyes: EOM are normal. Pupils are equal, round, and reactive to light. Left eye exhibits no nystagmus.  Cardiovascular: Normal rate, regular rhythm, normal heart sounds and intact distal pulses.   Pulmonary/Chest: Effort normal and breath sounds normal. No respiratory distress. She exhibits no tenderness.  Abdominal: Soft. Bowel sounds are normal. She exhibits no distension. There is no tenderness.  Musculoskeletal: She exhibits no edema.  Lymphadenopathy:    She has no cervical adenopathy.  Neurological: She is alert and oriented to person, place, and time. She has normal strength. No cranial nerve deficit or sensory deficit. She displays a negative Romberg sign. Coordination and gait normal. GCS eye subscore is 4. GCS verbal subscore is 5. GCS motor subscore is 6.  Reflex Scores:      Bicep reflexes are 2+ on the right side and 2+ on the left side.      Brachioradialis reflexes are 2+ on the right side and 2+ on the left side.      Patellar reflexes are 2+ on the right side and 2+ on the left side.      Positive Dix-Hallpike looking to the right  Skin: Skin is warm and dry.  Psychiatric: She has a normal mood and affect.    ED Course  Procedures (including critical care time)  Labs Reviewed  URINALYSIS, ROUTINE W REFLEX MICROSCOPIC - Abnormal; Notable for the following:    APPearance TURBID (*)     Specific Gravity, Urine 1.038 (*)     Glucose, UA >1000 (*)     Hgb urine dipstick LARGE (*)     Protein, ur 100 (*)     Leukocytes, UA MODERATE (*)       All other components within normal limits  CBC WITH DIFFERENTIAL - Abnormal; Notable for the following:    WBC 10.7 (*)     RBC 5.26 (*)     Neutro Abs 8.2 (*)     All other components within normal limits  COMPREHENSIVE METABOLIC PANEL - Abnormal; Notable for the following:    Sodium 130 (*)     Glucose, Bld 478 (*)     All other components within normal limits  URINE MICROSCOPIC-ADD ON - Abnormal; Notable for the following:    Bacteria, UA FEW (*)     All other components within normal limits  D-DIMER, QUANTITATIVE - Abnormal; Notable for the following:    D-Dimer, Quant 0.87 (*)     All other components within normal limits  POCT I-STAT TROPONIN I   Dg Chest 2 View  05/09/2012  *RADIOLOGY REPORT*  Clinical Data: Mid chest pain.  CHEST - 2 VIEW  Comparison: 10/09/2011.  Findings: Trachea is midline.  Heart size normal.  Lungs are clear. No pleural fluid.  IMPRESSION: No acute findings.  Original Report Authenticated By: Reyes Ivan, M.D.   Ct Angio Chest Pe  W/cm &/or Wo Cm  05/09/2012  *RADIOLOGY REPORT*  Clinical Data: Upper mid chest pain and pressure with shortness of breath  CT ANGIOGRAPHY CHEST  Technique:  Multidetector CT imaging of the chest using the standard protocol during bolus administration of intravenous contrast. Multiplanar reconstructed images including MIPs were obtained and reviewed to evaluate the vascular anatomy.  Contrast: OMNIPAQUE IOHEXOL 350 MG/ML SOLN  Comparison: None.  Findings: There are no filling defects within either pulmonary arterial system to suggest a segmental or subsegmental pulmonary embolus.  The thoracic aorta has a normal caliber and appearance. There is no axillary, hilar, mediastinal adenopathy.  There is a solitary 3.7 mm subpleural nodule in the lateral right upper lobe on image 27 ( If the patient is at high risk for bronchogenic carcinoma, follow-up chest CT at 1 year is recommended.  If the patient is at low risk, no follow-up  is needed.  This recommendation follows the consensus statement: Guidelines for Management of Small Pulmonary Nodules Detected on CT Scans:  A Statement from the Fleischner Society as published in Radiology 2005; 237:395-400.  ) Otherwise, the lungs are clear.  There are no osseous lesions.  There is fatty infiltration of the liver and small hiatal hernia.  IMPRESSION: There is no evidence of pulmonary embolus or other acute abnormality.  Original Report Authenticated By: Brandon Melnick, M.D.    Date: 05/09/2012  Rate: 127  Rhythm: sinus tachycardia  QRS Axis: normal  Intervals: normal  ST/T Wave abnormalities: normal  Conduction Disutrbances:none  Narrative Interpretation:   Old EKG Reviewed: none available     1. BPPV (benign paroxysmal positional vertigo)   2. UTI (urinary tract infection)   3. Chest pain   4. Hyperglycemia       MDM  46 year old female presenting with 2 separate complaints. Her first complaint is of dizziness for the past 2 days. She states every time she stands up she a brief frontal headache and a sensation of the room spinning around her that lasts for about 5-10 minutes, and improves when she sits down. She doesn't have any visual changes, nausea vomiting, any other neuro complaints. She has no recent head injuries, and has never had such symptoms previously. She has no difficulties with walking or with balance. Her second complaint is that last night she had onset of intermittent chest and abdominal pain. She describes it as a sharp pain in her mid sternum that immediately after starting migraines from her sternum down to her lower abdomen, and by the time the pain reaches her lower abdomen the pain in her chest is almost completely resolved. It takes about 5 minutes from when the pain starts in her chest until when it completely resolves in her abdomen. She says that this has occurred when she was cooking, as well as when she was making the bed, but was able to walk  around the grocery store this morning without feeling any chest or abdominal pain. She denies any shortness of breath, any nausea, any urinary symptoms, or any other complaints. She has not had previous similar symptoms. Denies any recent infectious symptoms, any positional chest pain, any previous cardiac history, no history of DVT or PE. Patient has positive Dix-Hallpike on looking to the right, neuro exam is otherwise unremarkable. Feel the patient's vertiginous symptoms are due to BPPV, and we'll treat her with meclizine and modified Epley maneuvers at home. The patient's complaints is not cardiac in etiology, and her troponin is negative. She does  have moderate leukocytosis as well as too numerous to count white blood cells and red blood cells on her UA with few bacteria, and this may be contributing to some of her symptoms. However do to her intermittent pain and her tachycardia, will check a d-dimer.  Patient with resolution of her vertiginous symptoms when moving around at this point in time. Her d-dimer is elevated so we'll get a CT PE.  CT PE was unremarkable. Discussed with the patient treatment at home, driving precautions due to her vertigo and taking the meclizine, followup with her regular doctor regarding the vertigo, as well as her chest and abdominal pain. Also discussed with the patient and her significant hyperglycemia at this point in time. She states she has never previously been told that her blood sugar was elevated. Discussed with the patient close followup with her doctor regarding this, as well as making dietary modifications. Discussed indications for which she should return, and the patient expresses understanding with this plan   Theotis Burrow, MD 05/09/12 1719

## 2012-05-09 NOTE — ED Notes (Signed)
Sharp abd pain and headache with dizziness with standing, onset 2 days ago.

## 2012-05-10 NOTE — ED Provider Notes (Signed)
I saw and evaluated the patient, reviewed the resident's note and I agree with the findings and plan and agree with their ECG interpretation. Multiple complaints. Hyperglycemia. Negative CT PE. Discharged with follow up.  Juliet Rude. Rubin Payor, MD 05/10/12 [phone]

## 2012-06-21 ENCOUNTER — Emergency Department (HOSPITAL_COMMUNITY)
Admission: EM | Admit: 2012-06-21 | Discharge: 2012-06-21 | Disposition: A | Discharge status: Home / Self Care | Payer: Medicaid Other | Source: Home / Self Care

## 2012-06-21 ENCOUNTER — Encounter (HOSPITAL_COMMUNITY): Payer: Self-pay | Admitting: Emergency Medicine

## 2012-06-21 DIAGNOSIS — IMO0002 Reserved for concepts with insufficient information to code with codable children: Secondary | ICD-10-CM

## 2012-06-21 DIAGNOSIS — S86911A Strain of unspecified muscle(s) and tendon(s) at lower leg level, right leg, initial encounter: Secondary | ICD-10-CM

## 2012-06-21 MED ORDER — TRAMADOL HCL 50 MG PO TABS: 50.0000 mg | ORAL_TABLET | Freq: Four times a day (QID) | ORAL | Status: AC | PRN

## 2012-06-21 NOTE — ED Provider Notes (Signed)
Medical screening examination/treatment/procedure(s) were performed by non-physician practitioner and as supervising physician I was immediately available for consultation/collaboration.  Huyen Perazzo   Tylan Kinn, MD 06/21/12 1139 

## 2012-06-21 NOTE — ED Provider Notes (Signed)
History     CSN: [id number]  Arrival date & time 06/21/12  1040   None     Chief Complaint  Patient presents with  . Knee Pain    (Consider location/radiation/quality/duration/timing/severity/associated sxs/prior treatment) HPI Comments: 1 d ago while getting out of bed she "swung" her knees around to place on floor and experienced sudden pain in the R knee. Gradually worsened over past 24 hrs. Can bear weight but with mild discomfort. No blunt trauma.   Patient is a 46 y.o. female presenting with knee pain.  Knee Pain    History reviewed. No pertinent past medical history.  History reviewed. No pertinent past surgical history.  No family history on file.  History  Substance Use Topics  . Smoking status: Never Smoker   . Smokeless tobacco: Not on file  . Alcohol Use: No    OB History    Grav Para Term Preterm Abortions TAB SAB Ect Mult Living                  Review of Systems  Constitutional: Positive for activity change. Negative for fever.  Respiratory: Negative.   Cardiovascular: Negative.   Musculoskeletal: Positive for joint swelling and gait problem. Negative for back pain.  Neurological: Negative.     Allergies  Penicillins  Home Medications   Current Outpatient Rx  Name Route Sig Dispense Refill  . CYCLOBENZAPRINE HCL 10 MG PO TABS Oral Take 10 mg by mouth 3 (three) times daily as needed.    . IBUPROFEN 200 MG PO TABS Oral Take 400 mg by mouth every 6 (six) hours as needed.    Marland Kitchen DIPHENHYDRAMINE HCL 25 MG PO TABS Oral Take 50 mg by mouth every 6 (six) hours as needed. For allergies    . FLUTICASONE PROPIONATE 50 MCG/ACT NA SUSP Nasal Place 2 sprays into the nose daily. 16 g 2  . TRAMADOL HCL 50 MG PO TABS Oral Take 1 tablet (50 mg total) by mouth every 6 (six) hours as needed for pain. 25 tablet 0    BP 132/93  Pulse 112  Temp 99.5 F (37.5 C) (Oral)  Resp 16  SpO2 97%  LMP 06/11/2012  Physical Exam  Constitutional: She is oriented to  person, place, and time. She appears well-developed and well-nourished.  Neck: Normal range of motion. Neck supple.  Musculoskeletal: She exhibits edema.       R knee with mild peripatellar swelling, no redness. Tenderness along entire medial aspect. She is so sensitive I am unable to perform an accurate exam with palpation, but tenderness in patellofemoral ligament and entire lateral aspect. No apparent joint effusion. Flexion to 80 deg. Ext to 180.  Neurological: She is alert and oriented to person, place, and time.  Skin: Skin is warm and dry. No erythema.  Psychiatric: She has a normal mood and affect.    ED Course  Procedures (including critical care time)  Labs Reviewed - No data to display No results found.   1. Strain of knee and leg, right       MDM  Ice prn and as directed Knee immobilizer most of the day Gradual rehab with small movements as demonstrated Limit activities for several days.         Hayden Rasmussen, NP 06/21/12 1126

## 2012-06-21 NOTE — ED Notes (Signed)
Started yesterday with right knee pain and swelling. States she swung her leg off the bed then pain started.

## 2013-01-17 ENCOUNTER — Observation Stay (HOSPITAL_COMMUNITY)
Admission: EM | Admit: 2013-01-17 | Discharge: 2013-01-18 | Disposition: A | Discharge status: Home / Self Care | Payer: Self-pay | Attending: Internal Medicine | Admitting: Internal Medicine

## 2013-01-17 ENCOUNTER — Emergency Department (HOSPITAL_COMMUNITY): Payer: Self-pay

## 2013-01-17 ENCOUNTER — Encounter (HOSPITAL_COMMUNITY): Payer: Self-pay | Admitting: Family Medicine

## 2013-01-17 DIAGNOSIS — R000 Tachycardia, unspecified: Secondary | ICD-10-CM

## 2013-01-17 DIAGNOSIS — E781 Pure hyperglyceridemia: Secondary | ICD-10-CM | POA: Insufficient documentation

## 2013-01-17 DIAGNOSIS — R209 Unspecified disturbances of skin sensation: Secondary | ICD-10-CM | POA: Insufficient documentation

## 2013-01-17 DIAGNOSIS — R079 Chest pain, unspecified: Principal | ICD-10-CM

## 2013-01-17 DIAGNOSIS — E119 Type 2 diabetes mellitus without complications: Secondary | ICD-10-CM

## 2013-01-17 DIAGNOSIS — N390 Urinary tract infection, site not specified: Secondary | ICD-10-CM | POA: Insufficient documentation

## 2013-01-17 DIAGNOSIS — E1165 Type 2 diabetes mellitus with hyperglycemia: Secondary | ICD-10-CM | POA: Diagnosis present

## 2013-01-17 DIAGNOSIS — E785 Hyperlipidemia, unspecified: Secondary | ICD-10-CM | POA: Insufficient documentation

## 2013-01-17 HISTORY — DX: Type 2 diabetes mellitus without complications: E11.9

## 2013-01-17 LAB — CBC
MCV: 79.5 fL (ref 78.0–100.0)
Platelets: 257 10*3/uL (ref 150–400)
RBC: 5.27 MIL/uL — ABNORMAL HIGH (ref 3.87–5.11)
WBC: 8 10*3/uL (ref 4.0–10.5)

## 2013-01-17 LAB — URINALYSIS, ROUTINE W REFLEX MICROSCOPIC
Glucose, UA: >1000 mg/dL — AB
Leukocytes, UA: NEGATIVE
Protein, ur: NEGATIVE mg/dL
Urobilinogen, UA: 0.2 mg/dL (ref 0.0–1.0)

## 2013-01-17 LAB — URINE MICROSCOPIC-ADD ON

## 2013-01-17 LAB — BASIC METABOLIC PANEL
CO2: 23 mEq/L (ref 19–32)
Calcium: 9 mg/dL (ref 8.4–10.5)
Chloride: 97 mEq/L (ref 96–112)
Potassium: 3.9 mEq/L (ref 3.5–5.1)
Sodium: 130 mEq/L — ABNORMAL LOW (ref 135–145)

## 2013-01-17 LAB — POCT I-STAT TROPONIN I: Troponin i, poc: 0 ng/mL (ref 0.00–0.08)

## 2013-01-17 LAB — HEMOGLOBIN A1C: Mean Plasma Glucose: 384 mg/dL — ABNORMAL HIGH (ref <117)

## 2013-01-17 MED ORDER — SODIUM CHLORIDE 0.9 % IV BOLUS (SEPSIS)
1000.0000 mL | Freq: Once | INTRAVENOUS | Status: AC
  Administered 2013-01-17: 1000 mL via INTRAVENOUS

## 2013-01-17 MED ORDER — METFORMIN HCL 500 MG PO TABS: 500.0000 mg | ORAL_TABLET | Freq: Two times a day (BID) | ORAL | Status: DC

## 2013-01-17 MED ORDER — ENOXAPARIN SODIUM 40 MG/0.4ML ~~LOC~~ SOLN
40.0000 mg | SUBCUTANEOUS | Status: DC
  Administered 2013-01-17: 40 mg via SUBCUTANEOUS
  Filled 2013-01-17 (×2): qty 0.4

## 2013-01-17 MED ORDER — NITROGLYCERIN 0.4 MG SL SUBL: 0.4000 mg | SUBLINGUAL_TABLET | SUBLINGUAL | Status: DC | PRN

## 2013-01-17 MED ORDER — ACETAMINOPHEN 325 MG PO TABS
650.0000 mg | ORAL_TABLET | Freq: Four times a day (QID) | ORAL | Status: DC | PRN
  Administered 2013-01-18: 650 mg via ORAL
  Filled 2013-01-17: qty 2

## 2013-01-17 MED ORDER — ASPIRIN EC 81 MG PO TBEC
81.0000 mg | DELAYED_RELEASE_TABLET | Freq: Every day | ORAL | Status: DC
  Administered 2013-01-18: 81 mg via ORAL
  Filled 2013-01-17 (×2): qty 1

## 2013-01-17 MED ORDER — ASPIRIN 81 MG PO CHEW
324.0000 mg | CHEWABLE_TABLET | Freq: Once | ORAL | Status: AC
  Administered 2013-01-17: 324 mg via ORAL
  Filled 2013-01-17: qty 4

## 2013-01-17 MED ORDER — INSULIN ASPART 100 UNIT/ML ~~LOC~~ SOLN
10.0000 [IU] | Freq: Once | SUBCUTANEOUS | Status: AC
  Administered 2013-01-17: 10 [IU] via INTRAVENOUS
  Filled 2013-01-17: qty 1

## 2013-01-17 MED ORDER — INSULIN ASPART 100 UNIT/ML ~~LOC~~ SOLN
0.0000 [IU] | Freq: Three times a day (TID) | SUBCUTANEOUS | Status: DC
  Administered 2013-01-18: 5 [IU] via SUBCUTANEOUS
  Administered 2013-01-18: 3 [IU] via SUBCUTANEOUS

## 2013-01-17 MED ORDER — SODIUM CHLORIDE 0.9 % IV SOLN
INTRAVENOUS | Status: DC
  Administered 2013-01-17: 21:00:00 via INTRAVENOUS

## 2013-01-17 MED ORDER — ACETAMINOPHEN 650 MG RE SUPP: 650.0000 mg | Freq: Four times a day (QID) | RECTAL | Status: DC | PRN

## 2013-01-17 MED ORDER — SODIUM CHLORIDE 0.9 % IJ SOLN
3.0000 mL | Freq: Two times a day (BID) | INTRAMUSCULAR | Status: DC
  Administered 2013-01-17 – 2013-01-18 (×2): 3 mL via INTRAVENOUS

## 2013-01-17 NOTE — H&P (Signed)
[facility] Admission Note Date: 01/17/2013  Patient name: April Smith Medical record number: [id number] Date of birth: 23-Apr-1966 Age: 47 y.o. Gender: female PCP: Follows at [facility]   Medical Service: Inpatient Medicine Teaching Service  Attending physician: Dr. Lars Mage     Chief Complaint: Chest pain  History of Present Illness: April Smith is a 47 yo woman with no significant past medical history who presents with a one day history of chest pain. She describes the pain as "pins and needles". It started the morning of 4/9 and continued to increase to 10/10 on pain scale. Nothing helped or worsened the pain. It does not radiate to her back or change with respirations. She reports that there was some pain to touch earlier but it now only tender. She denies associated fevers/chills, shortness of breath, headache, changes in vision, or nausea/vomiting. Pain had reduced to 4/5 after receiving a fluid bolus and aspirin in the ED. She denied experiencing any pain at the time of examination.   During this period she had also experienced tingling/numbness in her feet. She denies any recent trauma, sores or non-healing wounds. This has not happened before in that past. At time of examination she was no longer experiencing the tingling.   In the ED the patient was found to have a blood glucose of 468. The patient reports that she does not remember ever being told that she has elevated blood sugars. She denies any changes in her vision. She has been urinating frequently which she believes is related to her increased water intake with activity at her job. She denies episodes of passing out, double vision, or changes in her bowel movements.   Meds: Current Outpatient Rx  Name  Route  Sig  Dispense  Refill  . metFORMIN (GLUCOPHAGE) 500 MG tablet   Oral   Take 1 tablet (500 mg total) by mouth 2 (two) times daily with a meal.   60 tablet   0     Allergies: Allergies as of  01/17/2013 - Review Complete 01/17/2013  Allergen Reaction Noted  . Penicillins Swelling 10/09/2011   Past Medical History: No significant past medical history. Denies hypertension, hyperlipidemia   Past Surgical History: None.   Family History: Patient unaware of family history. One child has a heart murmur and history of Kawasaki disease. Other 3 children are in good health.   Social History: Patient works as a Financial trader. She has four children, 2 in college and 2 school-aged. She is a never smoker and denies alcohol or illicit drug use.   Review of Systems: As stated above. Further denies muscle aches and pains or dysuria.   Physical Exam: Filed Vitals:   01/17/13 1645  BP: 133/89  Pulse: 106  Temp:   Resp: 18   General: In no acute distress, resting comfortably on bed, anxious  Head: Normocephalic, atraumatic Eyes: PERRL, EOMI, sclera anicteric  ENT: Moist nasal and oral mucosa Neck: Supple, without lymphadenopathy CV: Tachycardic, regular rhythm, normal S1 and S2, no murmurs, rubs or gallops appreciated Lungs: Normal work of breathing, clear to auscultation bilaterally Abdomen: + bowel sounds, soft, non-tender, non-distended Extremities: Warm, without edema.  Neuro: Alert and oriented x 3, cranial nerves II-XII grossly intact, strength 5/5 in bilateral upper and lower extremities. Sensation intact throughout. Psyc: repeated hang-wringing during discussion, easily calmed with reassurance  Skin: Warm and dry  Lab results: Basic Metabolic Panel:  Recent Labs  41/32/44 1000  NA 130*  K 3.9  CL 97  CO2 23  GLUCOSE 468*  BUN 7  CREATININE 0.70  CALCIUM 9.0   CBC:  Recent Labs  01/17/13 1000  WBC 8.0  HGB 14.6  HCT 41.9  MCV 79.5  PLT 257   D-Dimer:  Recent Labs  01/17/13 1047  DDIMER <0.27   CBG (last 3)   Recent Labs  01/17/13 1512  GLUCAP 221*    Urinalysis:  Recent Labs  01/17/13 1210  COLORURINE YELLOW  LABSPEC 1.005  PHURINE  5.5  GLUCOSEU >1000*  HGBUR NEGATIVE  BILIRUBINUR NEGATIVE  KETONESUR NEGATIVE  PROTEINUR NEGATIVE  UROBILINOGEN 0.2  NITRITE NEGATIVE  LEUKOCYTESUR NEGATIVE   Troponin (Point of Care Test)  Recent Labs  01/17/13 1427  TROPIPOC 0.00    Imaging results:  Dg Chest 2 View  01/17/2013  *RADIOLOGY REPORT*  Clinical Data: Chest pain  CHEST - 2 VIEW  Comparison: 10/09/2011; 04/26/2007; chest CT - 05/09/2012  Findings: Grossly unchanged cardiac silhouette and mediastinal contours.  There is mild elevation of the right hemidiaphragm. Grossly unchanged mild diffuse thickening of the pulmonary interstitium.  No focal airspace opacity.  No pleural effusion or pneumothorax.  No definite evidence of edema.  Unchanged bones.  IMPRESSION: Chronic bronchitic change without acute cardiopulmonary disease.   Original Report Authenticated By: Tacey Ruiz, MD     Other results: EKG: sinus tachycardia  Assessment & Plan by Problem: April Smith is a 47 yo woman with no significant PMH who presented to the ED with chest pain and found to have a blood glucose of 468. Patient with new onset diabetes to receive education and further evaluation and management. She will further undergo cardiac work-up for chest pain and tachycardia.   Active Problems:   Newly diagnosed diabetes   Tachycardia   Chest pain  1. Newly diagnosed diabetes: Patient found in the ED to have a blood glucose of 468. She was given a 1L bolus of NS and started on sliding scale insulin. Repeat CBG 221. Recent polydipsia and polyuria consistent with hyperglycemia. Lab review reveals patient has had some elevated blood glucoses in the past without extremes. Hyponatremia likely secondary to hyperglycemia. Presentation not concerning for DKA as patient has no ketones in urine and does not have an anion gap. Tachycardia and recent polyuria likely leading to dehydration. The latter with elevated glucose levels may indicate early HHS. Will need  initiation of diabetes education and pharmacologic management. Reviewed some common topics in diabetes education and patient is willing and interested in participating.  - Admit to telemetry, floor status - Will continue sliding scale insulin - IVF NS at 150 mL/hr  - HbA1c pending - lipid panel for risk stratification - follow-up AM BMET  - CBG monitoring before meals and at bedtime.  - Consult to diabetes coordinator    2. Tachycardia: Patient tachycardic to low 100s. Likely secondary to increased polyuria in the context of elevated blood glucose levels.  - IVF at 150 mL/hr for fluid repletion  3. Chest Pain: Patient presents with 1 day history of "pins and needles" chest pain. Intensity of pain initially concerning for acute CAD. Patient given aspirin in ED. EKG without acute changes, in sinus tachycardia. Initial troponin level 0.00. D-Dimer negative. Results less concerning for acute cardiac process. Symptoms have since improved. Patient anxious during discussions exhibiting repeated hand wringing which may be contributing.  -cycling troponins  -nitroglycerin 0.4 mg sublingual q5 min prn - aspirin 81 qday  4. Tingling/numbness in feet, resolved: Concern  for neuropathy given presentation with elevated blood glucoses. However symptoms are transient making association less likely. No obvious trauma or deformity to feet. May be associated with reduced perfusion but patient does not appear to be significantly volume depleted on exam. Sensation intact on examination. -Will monitor for reoccurrence  5. Asymptomatic Bactiuria: Urine microscopic evaluation revealed 11-20 WBCs and many bacteria. UA negative for nitrites and leukocyte esterase. Patient denies dysuria and urgency but has had polyuria. Polyuria likely due to hyperglycemia. Pregnancy test negative.  - holding antibiotic therapy as patient has no comorbidities necessitating treatment.   6. FEN/GI - modified carb diet - IVF at 150  mL/hr  7. PPx - lovenox 40 mg qday  Code status: Full Code  This is a Psychologist, occupational Note.  The care of the patient was discussed with Dr. Collier Bullock and Dr. Eben Burow and the assessment and plan was formulated with their assistance.  Please see their note for official documentation of the patient encounter.   SignedJiles Crocker 01/17/2013, 6:20 PM

## 2013-01-17 NOTE — ED Notes (Signed)
Pt brought to room from triage and getting undressed and into a gown at this time

## 2013-01-17 NOTE — ED Notes (Signed)
Per pt sts yesterday she began having chest pain, SOB and numbness and tingling in her hands and feet. sts worse today. Denies N,V. sts some dizziness

## 2013-01-17 NOTE — H&P (Addendum)
Resident Co-sign H&P: I have seen the patient and reviewed the H and P note by Jiles Crocker, MS 3, and discussed the care of the patient with them.  See below for documentation of my findings, assessment, and plans.  Subjective: April Smith is a 47 year old American female without a significant past medical history who presents with one day of chest pain. Described as pins and needles, started this morning and progressively worsened throughout the day. She does not note any changes with her pain with position, breathing, food. She denies fever, chills, shortness of breath, nausea vomiting, diaphoresis. Started as 4/10 then progressed to 10 out of 10. In the ED, her pain had just about resolved. She received fluids as well as aspirin in the ED. Pain is 0/10 when we see her. ROS + for numbness/tingling in her legs and feet.  Blood sugar in the ED was also found to be 468. Patient denies a history of diabetes or being told that she has had high blood sugars in the past. She denies any vision changes, however, she has had some urinary frequency but no dysuria.  Patient does not take any medications at home. She cleans houses for work. She denies any tobacco, alcohol, marijuana, cocaine, or other drug use.  Allergic to penicillin (throat closes up, rash).  Patient grew up in foster care therefore she does not know what her family history is.   Objective: Vital signs in last 24 hours: Filed Vitals:   01/17/13 1515 01/17/13 1615 01/17/13 1645 01/17/13 1820  BP: 128/93 141/85 133/89 143/90  Pulse: 107 108 106 103  Temp:    99.1 F (37.3 C)  TempSrc:    Oral  Resp: 18 19 18 18   Height:    5\' 2"  (1.575 m)  Weight:    203 lb 12.8 oz (92.443 kg)  SpO2: 98% 98% 98% 100%   Physical Exam Blood pressure 143/90, pulse 103, temperature 99.1 F (37.3 C), temperature source Oral, resp. rate 18, height 5\' 2"  (1.575 m), weight 203 lb 12.8 oz (92.443 kg), last menstrual period 12/29/2012, SpO2  100.00%. General:  No acute distress, alert and oriented x 3, well-appearing AAF HEENT:  PERRL, EOMI, slightly dry mucous membranes Cardiovascular:  Regular rate and rhythm, no murmurs, rubs or gallops Respiratory:  Clear to auscultation bilaterally, no wheezes, rales, or rhonchi Abdomen:  Soft, nondistended, nontender, bowel sounds present Extremities:  Warm and well-perfused, no edema.  Skin: Warm, dry, no rashes Neuro: Not anxious appearing, no depressed mood, normal affect. Pt doing left hand wringing/repetitive motions.  Lab Results: Reviewed and documented in Electronic Record Micro Results: Reviewed and documented in Electronic Record Studies/Results: Reviewed and documented in Electronic Record Medications: I have reviewed the patient's current medications. Scheduled Meds: . aspirin EC  81 mg Oral Daily  . enoxaparin (LOVENOX) injection  40 mg Subcutaneous Q24H  . [START ON 01/18/2013] insulin aspart  0-9 Units Subcutaneous TID WC  . sodium chloride  3 mL Intravenous Q12H   Continuous Infusions: . sodium chloride     PRN Meds:.acetaminophen, acetaminophen, nitroGLYCERIN Assessment/Plan: Active Problems:   Newly diagnosed diabetes   Tachycardia   Chest pain  Chest pain Atypical presentation, history not consistent with ACS. TIMI score is 0, 5% risk in the next 30 days of cardiac event. Chest x-ray with possible vascular edema but no consolidation, no cardiomegaly, no effusion. D-dimer negative. Initial troponins in the ED negative. Patient did have associated numbness and tingling in arms and legs, which is  more typical of anxiety. Will monitor overnight as well as treat her other problems including newly diagnosed diabetes.  -Admit to telemetry -Cycle troponins overnight -Daily aspirin -Nitroglycerin when necessary, oxygen if needed  Hyperglycemia in setting of newly diagnosed diabetes, likely type II Blood sugar on admission was 468. Patient was given 1 L bolus of  normal saline in the ED and fast acting insulin. No anion gap. Patient does have glucose in her urine, no ketones. Hyperglycemia likely causing dehydration and tachycardia.   -Hemoglobin A1c pending -IV fluid at 150 mL per hour overnight for rehydration -CBG monitoring and sliding scale -Consult to inpatient diabetes educator -Card modified diet -patient will need follow up and education, can establish care in [facility] if desired as she does not have a PCP currently  Asymptomatic bacteriuria Patient with some urinary frequency but no dysuria. UA was negative for nitrites and leukocyte esterase but microscopic revealed 11-20 WBCs and many bacteria. Patient's reports of urinary frequency were likely explained by her hyperglycemia, however, we will followup on the urine cultures and treat if organisms grow.  DVT -Lovenox   Dispo Anticipate discharge in 1 to 2 days The patient does not have a PCP, therefore may be requiring OPC followup      LOS: 0 days   Denton Ar 01/17/2013, 6:45 PM

## 2013-01-17 NOTE — ED Notes (Signed)
Pt undressed, in gown, on monitor, continuous pulse oximetry and blood pressure cuff 

## 2013-01-17 NOTE — ED Provider Notes (Signed)
History     CSN: [id number]  Arrival date & time 01/17/13  1478   First MD Initiated Contact with Patient 01/17/13 1009      Chief Complaint  Patient presents with  . Chest Pain  . Numbness    (Consider location/radiation/quality/duration/timing/severity/associated sxs/prior treatment) HPI Pt states she has had episodic chest pain, described as sharp. No SOB, cough, fever, chills. No lower ext swelling or pain. +paresthesias to hands and feet. No recent travel or surgery. History reviewed. No pertinent past medical history.  History reviewed. No pertinent past surgical history.  History reviewed. No pertinent family history.  History  Substance Use Topics  . Smoking status: Never Smoker   . Smokeless tobacco: Not on file  . Alcohol Use: No    OB History   Grav Para Term Preterm Abortions TAB SAB Ect Mult Living                  Review of Systems  Constitutional: Negative for fever and chills.  Respiratory: Negative for shortness of breath.   Cardiovascular: Positive for chest pain. Negative for palpitations and leg swelling.  Gastrointestinal: Negative for nausea, vomiting and abdominal pain.  Musculoskeletal: Negative for myalgias and back pain.  Skin: Negative for rash and wound.  Neurological: Positive for numbness. Negative for dizziness, weakness, light-headedness and headaches.  All other systems reviewed and are negative.    Allergies  Penicillins  Home Medications   Current Outpatient Rx  Name  Route  Sig  Dispense  Refill  . aspirin EC 81 MG EC tablet   Oral   Take 1 tablet (81 mg total) by mouth daily.         . carvedilol (COREG) 3.125 MG tablet   Oral   Take 1 tablet (3.125 mg total) by mouth 2 (two) times daily with a meal. For your heart   30 tablet   4   . ciprofloxacin (CIPRO) 250 MG tablet   Oral   Take 1 tablet (250 mg total) by mouth 2 (two) times daily.   6 tablet   0   . fluticasone (FLONASE) 50 MCG/ACT nasal spray    Nasal   Place 2 sprays into the nose daily as needed for rhinitis.         Marland Kitchen ibuprofen (ADVIL,MOTRIN) 200 MG tablet   Oral   Take 400 mg by mouth every 6 (six) hours as needed for pain.          Marland Kitchen insulin glargine (LANTUS SOLOSTAR) 100 UNIT/ML injection   Subcutaneous   Inject 0.1 mLs (10 Units total) into the skin at bedtime.   10 mL   4   . Insulin Pen Needle (BD PEN NEEDLE NANO U/F) 32G X 4 MM MISC   Does not apply   1 each by Does not apply route once.   50 each   4   . lisinopril (PRINIVIL,ZESTRIL) 2.5 MG tablet   Oral   Take 1 tablet (2.5 mg total) by mouth daily. For kidney protection   30 tablet   4   . lovastatin (MEVACOR) 10 MG tablet   Oral   Take 1 tablet (10 mg total) by mouth at bedtime. For cholesterol   30 tablet   4   . metFORMIN (GLUCOPHAGE) 500 MG tablet   Oral   Take 1 tablet (500 mg total) by mouth 2 (two) times daily with a meal.   60 tablet   0     BP  118/71  Pulse 98  Temp(Src) 98.6 F (37 C) (Oral)  Resp 18  Ht 5\' 2"  (1.575 m)  Wt 203 lb (92.08 kg)  BMI 37.12 kg/m2  SpO2 100%  LMP 12/29/2012  Physical Exam  Nursing note and vitals reviewed. Constitutional: She is oriented to person, place, and time. She appears well-developed and well-nourished. No distress.  HENT:  Head: Normocephalic and atraumatic.  Mouth/Throat: Oropharynx is clear and moist.  Eyes: EOM are normal. Pupils are equal, round, and reactive to light.  Neck: Normal range of motion. Neck supple.  Cardiovascular: Normal rate and regular rhythm.   Pulmonary/Chest: Effort normal and breath sounds normal. No respiratory distress. She has no wheezes. She has no rales. She exhibits no tenderness.  Abdominal: Soft. Bowel sounds are normal. She exhibits no distension and no mass. There is no tenderness. There is no rebound and no guarding.  Musculoskeletal: Normal range of motion. She exhibits no edema and no tenderness.  No calf swelling or pain  Neurological: She is  alert and oriented to person, place, and time.  5/5 motor, sensation intact  Skin: Skin is warm and dry. No rash noted. No erythema.  Psychiatric: She has a normal mood and affect. Her behavior is normal.    ED Course  Procedures (including critical care time)  Labs Reviewed  BASIC METABOLIC PANEL - Abnormal; Notable for the following:    Sodium 130 (*)    Glucose, Bld 468 (*)    All other components within normal limits  CBC - Abnormal; Notable for the following:    RBC 5.27 (*)    All other components within normal limits  URINALYSIS, ROUTINE W REFLEX MICROSCOPIC - Abnormal; Notable for the following:    Glucose, UA >1000 (*)    All other components within normal limits  URINE MICROSCOPIC-ADD ON - Abnormal; Notable for the following:    Bacteria, UA MANY (*)    All other components within normal limits  GLUCOSE, CAPILLARY - Abnormal; Notable for the following:    Glucose-Capillary 221 (*)    All other components within normal limits  HEMOGLOBIN A1C - Abnormal; Notable for the following:    Hemoglobin A1C 15.0 (*)    Mean Plasma Glucose 384 (*)    All other components within normal limits  BASIC METABOLIC PANEL - Abnormal; Notable for the following:    Sodium 131 (*)    Glucose, Bld 319 (*)    All other components within normal limits  LIPID PANEL - Abnormal; Notable for the following:    Triglycerides 309 (*)    HDL 36 (*)    VLDL 62 (*)    All other components within normal limits  GLUCOSE, CAPILLARY - Abnormal; Notable for the following:    Glucose-Capillary 297 (*)    All other components within normal limits  GLUCOSE, CAPILLARY - Abnormal; Notable for the following:    Glucose-Capillary 243 (*)    All other components within normal limits  GLUCOSE, CAPILLARY - Abnormal; Notable for the following:    Glucose-Capillary 279 (*)    All other components within normal limits  URINE CULTURE  D-DIMER, QUANTITATIVE  TROPONIN I  TROPONIN I  CBC  POCT PREGNANCY, URINE   POCT I-STAT TROPONIN I   No results found.   1. Diabetes   2. Chest pain   3. Newly diagnosed diabetes   4. Tachycardia      Date: 01/17/2013  Rate:120  Rhythm: sinus tachycardia  QRS Axis: normal  Intervals: normal  ST/T Wave abnormalities: normal  Conduction Disutrbances:none  Narrative Interpretation:   Old EKG Reviewed: none available    MDM   Pt states she is feeling better after insulin and IVF's. No current CP. Discussed with internal med teaching service and will see in ED.        Loren Racer, MD 01/20/13 [phone]

## 2013-01-17 NOTE — ED Provider Notes (Signed)
Pt seen by residents for admission.  Pt refuses. They have made arrangements for close follow up this week.  Requested pt be prescribed 500 mg metformin  twice daily.  Celene Kras, MD 01/17/13 [phone]

## 2013-01-18 LAB — CBC
HCT: 38.2 % (ref 36.0–46.0)
Hemoglobin: 13.4 g/dL (ref 12.0–15.0)
MCH: 27.9 pg (ref 26.0–34.0)
RBC: 4.81 MIL/uL (ref 3.87–5.11)

## 2013-01-18 LAB — LIPID PANEL
Cholesterol: 141 mg/dL (ref 0–200)
Total CHOL/HDL Ratio: 3.9 RATIO
VLDL: 62 mg/dL — ABNORMAL HIGH (ref 0–40)

## 2013-01-18 LAB — BASIC METABOLIC PANEL
CO2: 23 mEq/L (ref 19–32)
Chloride: 101 mEq/L (ref 96–112)
GFR calc non Af Amer: >90 mL/min (ref >90)
Glucose, Bld: 319 mg/dL — ABNORMAL HIGH (ref 70–99)
Potassium: 3.8 mEq/L (ref 3.5–5.1)
Sodium: 131 mEq/L — ABNORMAL LOW (ref 135–145)

## 2013-01-18 LAB — GLUCOSE, CAPILLARY: Glucose-Capillary: 279 mg/dL — ABNORMAL HIGH (ref 70–99)

## 2013-01-18 MED ORDER — INSULIN GLARGINE 100 UNIT/ML ~~LOC~~ SOLN: 10.0000 [IU] | Freq: Every day | SUBCUTANEOUS | Status: DC

## 2013-01-18 MED ORDER — CIPROFLOXACIN HCL 250 MG PO TABS: 250.0000 mg | ORAL_TABLET | Freq: Two times a day (BID) | ORAL | Status: DC

## 2013-01-18 MED ORDER — INSULIN PEN NEEDLE 32G X 4 MM MISC
1.0000 | Freq: Once | Status: AC
  Administered 2013-01-18: 1
  Filled 2013-01-18 (×2): qty 1

## 2013-01-18 MED ORDER — LIVING WELL WITH DIABETES BOOK
Freq: Once | Status: AC
  Administered 2013-01-18: 13:00:00
  Filled 2013-01-18: qty 1

## 2013-01-18 MED ORDER — METFORMIN HCL 500 MG PO TABS: 500.0000 mg | ORAL_TABLET | Freq: Two times a day (BID) | ORAL | Status: DC

## 2013-01-18 MED ORDER — ASPIRIN 81 MG PO TBEC: 81.0000 mg | DELAYED_RELEASE_TABLET | Freq: Every day | ORAL | Status: DC

## 2013-01-18 MED ORDER — CARVEDILOL 3.125 MG PO TABS: 3.1250 mg | ORAL_TABLET | Freq: Two times a day (BID) | ORAL | Status: DC

## 2013-01-18 MED ORDER — INSULIN PEN STARTER KIT: 1.0000 | Freq: Once | Status: DC

## 2013-01-18 MED ORDER — INSULIN PEN NEEDLE 32G X 4 MM MISC: 1.0000 | Freq: Once | Status: DC

## 2013-01-18 MED ORDER — INSULIN GLARGINE 100 UNIT/ML ~~LOC~~ SOLN
10.0000 [IU] | Freq: Every day | SUBCUTANEOUS | Status: DC
  Filled 2013-01-18: qty 0.1

## 2013-01-18 MED ORDER — INSULIN GLARGINE 100 UNITS/ML SOLOSTAR PEN: 10.0000 [IU] | PEN_INJECTOR | Freq: Every day | SUBCUTANEOUS | Status: DC

## 2013-01-18 MED ORDER — LISINOPRIL 2.5 MG PO TABS: 2.5000 mg | ORAL_TABLET | Freq: Every day | ORAL | Status: DC

## 2013-01-18 MED ORDER — LOVASTATIN 10 MG PO TABS: 10.0000 mg | ORAL_TABLET | Freq: Every day | ORAL | Status: DC

## 2013-01-18 NOTE — H&P (Signed)
Internal Medicine Attending Admission Note Date: 01/18/2013  Patient name: April Smith Medical record number: [id number] Date of birth: 06-04-1966 Age: 47 y.o. Gender: female  I saw and evaluated the patient. I reviewed the resident's note and I agree with the resident's findings and plan as documented in the resident's note.  Chief Complaint(s): Chest pain  History - key components related to admission: Patient is a 47 year old female with past medical history most significant for newly diagnosed diabetes who comes in with a one-day complaint of chest pain. Chest pain was described as pins and needles, started as 4/10 but progressed to 10 out of 10 in severity, no exacerbating factors, relieved with pain medications in ER, pain is 0/10 at this time, no radiation noted, pain present in the middle of the chest, no associated symptoms.  Patient's blood sugars were found to be very high in the ER and her HbA1c came back as 15. Patient does not have a primary care physician and has not followed up with any physician for many years. She is not on any medications.  15 point review of system is negative except for as noted above.  Past medical history, past surgical history, medications, social history and family history was reviewed and is as per resident's note.   Physical Exam - key components related to admission:  Filed Vitals:   01/17/13 1645 01/17/13 1820 01/17/13 1953 01/18/13 0416  BP: 133/89 143/90 117/63 118/71  Pulse: 106 103 112 98  Temp:  99.1 F (37.3 C) 99.1 F (37.3 C) 98.6 F (37 C)  TempSrc:  Oral Oral Oral  Resp: 18 18    Height:  5\' 2"  (1.575 m)    Weight:  203 lb 12.8 oz (92.443 kg)  203 lb (92.08 kg)  SpO2: 98% 100% 100% 100%   Physical Exam: General: Vital signs reviewed and noted. Well-developed, well-nourished, in no acute distress; alert, appropriate and cooperative throughout examination.  Head: Normocephalic, atraumatic.  Eyes: PERRL, EOMI, No signs of  anemia or jaundince.  Nose: Mucous membranes moist, not inflammed, nonerythematous.  Throat: Oropharynx nonerythematous, no exudate appreciated.   Neck: No deformities, masses, or tenderness noted.Supple, No carotid Bruits, no JVD.  Lungs:  Normal respiratory effort. Clear to auscultation BL without crackles or wheezes.  Heart: RRR. S1 and S2 normal without gallop, murmur, or rubs.  Abdomen:  BS normoactive. Soft, Nondistended, non-tender.  No masses or organomegaly.  Extremities: No pretibial edema.  Neurologic: A&O X3, CN II - XII are grossly intact. Motor strength is 5/5 in the all 4 extremities, Sensations intact to light touch, Cerebellar signs negative.  Skin: No visible rashes, scars.    Lab results:   Basic Metabolic Panel:  Recent Labs  56/21/30 1000 01/18/13 0136  NA 130* 131*  K 3.9 3.8  CL 97 101  CO2 23 23  GLUCOSE 468* 319*  BUN 7 11  CREATININE 0.70 0.74  CALCIUM 9.0 8.6   CBC:  Recent Labs  01/17/13 1000 01/18/13 0136  WBC 8.0 10.2  HGB 14.6 13.4  HCT 41.9 38.2  MCV 79.5 79.4  PLT 257 225   Cardiac Enzymes:  Recent Labs  01/17/13 1947 01/18/13 0136  TROPONINI <0.30 <0.30   BNP: No components found with this basename: POCBNP,  D-Dimer:  Recent Labs  01/17/13 1047  DDIMER <0.27   CBG:  Recent Labs  01/17/13 1512 01/17/13 1957 01/18/13 0742  GLUCAP 221* 297* 243*   Hemoglobin A1C:  Recent Labs  01/17/13 1528  HGBA1C 15.0*   Fasting Lipid Panel:  Recent Labs  01/18/13 0136  CHOL 141  HDL 36*  LDLCALC 43  TRIG 454*  CHOLHDL 3.9    Imaging results:  Dg Chest 2 View  01/17/2013  *RADIOLOGY REPORT*  Clinical Data: Chest pain  CHEST - 2 VIEW  Comparison: 10/09/2011; 04/26/2007; chest CT - 05/09/2012  Findings: Grossly unchanged cardiac silhouette and mediastinal contours.  There is mild elevation of the right hemidiaphragm. Grossly unchanged mild diffuse thickening of the pulmonary interstitium.  No focal airspace opacity.   No pleural effusion or pneumothorax.  No definite evidence of edema.  Unchanged bones.  IMPRESSION: Chronic bronchitic change without acute cardiopulmonary disease.   Original Report Authenticated By: Tacey Ruiz, MD     Other results: EKG: 120 beats per minute, sinus rhythm, tachycardia, normal axis, no ST or T wave changes noted normal EKG. Compared with previous EKG which also showed tachycardia from July 2013.  Assessment & Plan by Problem:  Principal Problem:   Chest pain Active Problems:   Newly diagnosed diabetes   Tachycardia  Patient is a 47 year old female with past medical history most significant for undiagnosed diabetes who presents with chest pain. HbA1c is found to be 15. Patient does not have any EKG changes and her troponins have been negative x3 so far. Patient is very low risk for having acute coronary syndrome at this time. The most important issue will be managing her new diabetes and getting a primary care physician. Patient should also get an outpatient stress test to rule out acute coronary syndrome. -Aspirin, metformin, beta blocker, lisinopril low-dose on discharge -Outpatient stress test followup -Find a primary care physician for followup  Can be safely discharged home today with the above things.  Lars Mage MD Faculty-Internal Medicine Residency Program

## 2013-01-18 NOTE — Progress Notes (Signed)
Resident Co-sign Daily Note: I have seen the patient and reviewed the daily progress note by Jiles Crocker MS 3 and discussed the care of the patient with them.  See below for documentation of my findings, assessment, and plans.  Subjective: April Smith is doing very well this morning. She states she feels better than she has "in years." She feels able to administer lantus herself and is happy to get proper treatment for diabetes. Her chest pain has resolved, no further episodes.  No events overnight.  Objective: Vital signs in last 24 hours: Filed Vitals:   01/17/13 1645 01/17/13 1820 01/17/13 1953 01/18/13 0416  BP: 133/89 143/90 117/63 118/71  Pulse: 106 103 112 98  Temp:  99.1 F (37.3 C) 99.1 F (37.3 C) 98.6 F (37 C)  TempSrc:  Oral Oral Oral  Resp: 18 18    Height:  5\' 2"  (1.575 m)    Weight:  203 lb 12.8 oz (92.443 kg)  203 lb (92.08 kg)  SpO2: 98% 100% 100% 100%    Physical Exam Blood pressure 118/71, pulse 98, temperature 98.6 F (37 C), temperature source Oral, resp. rate 18, height 5\' 2"  (1.575 m), weight 203 lb (92.08 kg), last menstrual period 12/29/2012, SpO2 100.00%. General:  No acute distress, alert and oriented x 3, well-appearing AAF HEENT:  PERRL, EOMI, moist mucous membranes Cardiovascular:  Regular rate and rhythm, no murmurs, rubs or gallops Respiratory:  Clear to auscultation bilaterally, no wheezes, rales, or rhonchi Abdomen:  Soft, nondistended, nontender, bowel sounds present Extremities:  Warm and well-perfused, no edema.  Skin: Warm, dry, no rashes Neuro: Not anxious appearing, no depressed mood, normal affect  Lab Results: Reviewed and documented in Electronic Record Micro Results: Reviewed and documented in Electronic Record Studies/Results: Reviewed and documented in Electronic Record Medications: medications reviewed Scheduled Meds: Continuous Infusions: PRN Meds:.   Assessment/Plan: Principal Problem:   Chest pain Active  Problems:   Newly diagnosed diabetes   Tachycardia  1. Chest Pain: Per patient, chest pain has resolved. Troponins negative and EKG without acute changes. Little concern for acute coronary process with normal EKG. Likely component of anxiety.  - start aspirin 81 mg qd  - notified patient to contact physician and come to ED if symptoms reoccur.  - will need referral for outpatient stress test   2. Newly diagnosed diabetes: Patient's blood glucose normalizing with sliding scale insulin, most recent 279. She reports feeling much improved without chest pain or numbness/tingling in feet. HbA1c at 15.5 indicating prolonged disease. Will need continued diabetes education and pharmacologic management. Patient has started education in house and is motivated to continue.  - Transitioning from sliding scale insulin to Lantus 10. Will discharge patient on this regimen with pens  - Starting metformin 500 BID  - Discontinuing IVF  - Lipid panel notable for hyperlipidemia and hypertriglyceridemia. Starting lovastatin 10 mg.  - Starting aspirin qd, lisinopril 2.5 mg, carvedilol 3.125 mg per diabetic management guidelines  - Follow-up BMP in outpatient setting  - Follow-up in IM clinic for diabetic education and continued monitoring   3. Tachycardia: Patient continues to be mildly tachycardic. Tolerated fluid repletion well. Likely from dehydration. - Starting carvedilol as above   4. Tingling/numbness in feet, resolved: Symptoms have not reoccurred. Unlikely diabetic neuropathy given transient nature but patient will have to be evaluated for small vessels disease given A1c level. Sensation intact on examination.  -Continue monitoring for reoccurrence   5. Asymptomatic Bactiuria: Urine microscopic evaluation revealed 11-20 WBCs and  many bacteria. UA negative for nitrites and leukocyte esterase. Urine culture with E. Coli. Patient only with polyuria, likely associated with hyperglycemia. Could also be  associated with UTI. Given persistent elevation in blood glucose levels and consequent risk for infection will proceed with antibiotic therapy.  - starting ciprofloxacin 250 mg BID  - will follow-up urinary symptoms in the outpatient setting   6. FEN/GI  - modified carb diet, continuing with education in the outpatient setting.  - discontinuing IVF   7. PPx  - lovenox 40 mg qday, discontinuing at discharge   Dispo: Patient to discharge home today with follow-up in IM clinic with diabetes education and MD.  Code status: Full Code   Length of Stay: 1 days    LOS: 1 day   Denton Ar 01/18/2013, 8:38 PM

## 2013-01-18 NOTE — Progress Notes (Signed)
Utilization review completed. Burnie Hank, RN, BSN. 

## 2013-01-18 NOTE — Progress Notes (Signed)
Medical Student Daily Progress Note   Subjective:   Patient had no acute events overnight. She reports feeling better than she has in a long time. Had one episode of chest pain overnight that resolved without any intervention. She currently is not experiencing sternal tenderness. Has not experienced any numbness/tingling in fingers or toes. She has started watching diabetes education videos and feels that she is doing well with these.   Objective:    Vital Signs:   Temp:  [98.6 F (37 C)-99.1 F (37.3 C)] 98.6 F (37 C) (04/10 0416) Pulse Rate:  [98-112] 98 (04/10 0416) Resp:  [18-24] 18 (04/09 1820) BP: (117-143)/(63-96) 118/71 mmHg (04/10 0416) SpO2:  [97 %-100 %] 100 % (04/10 0416) Weight:  [92.08 kg (203 lb)-92.443 kg (203 lb 12.8 oz)] 92.08 kg (203 lb) (04/10 0416) Last BM Date: 01/17/13   Weights: 24-hour Weight change:   Filed Weights   01/17/13 1820 01/18/13 0416  Weight: 92.443 kg (203 lb 12.8 oz) 92.08 kg (203 lb)     Intake/Output:   Intake/Output Summary (Last 24 hours) at 01/18/13 1243 Last data filed at 01/17/13 1747  Gross per 24 hour  Intake   1000 ml  Output      0 ml  Net   1000 ml      Physical Exam: General: In no acute distress, resting comfortably in bed Eyes: PERRL, EOMI, sclera anicteric ENT: Moist nasal and oral mucosa Neck: Supple, without lymphadenopathy or JVD CV: Tachycardic, regular rhythm, normal S1 and S2, no murmurs, rubs or gallops appreciated Pulm: Clear to auscultation bilaterally, normal work of breathing Abdomen: + bowel sounds, soft, non-tender, no masses palpated Extremities: Warm, well-perfused  Neuro: Alert and oriented x 3, strength 5/5 in bilateral upper and lower extremities. Sensation intact throughout.  Skin: warm and dry, without rash    Labs: Basic Metabolic Panel:  Recent Labs Lab 01/17/13 1000 01/18/13 0136  NA 130* 131*  K 3.9 3.8  CL 97 101  CO2 23 23  GLUCOSE 468* 319*  BUN 7 11  CREATININE 0.70  0.74  CALCIUM 9.0 8.6    CBC:  Recent Labs Lab 01/17/13 1000 01/18/13 0136  WBC 8.0 10.2  HGB 14.6 13.4  HCT 41.9 38.2  MCV 79.5 79.4  PLT 257 225   Lipid Panel     Component Value Date/Time   CHOL 141 01/18/2013 0136   TRIG 309* 01/18/2013 0136   HDL 36* 01/18/2013 0136   CHOLHDL 3.9 01/18/2013 0136   VLDL 62* 01/18/2013 0136   LDLCALC 43 01/18/2013 0136     Cardiac Enzymes:  Recent Labs Lab 01/17/13 1947 01/18/13 0136  TROPONINI <0.30 <0.30    CBG:  Recent Labs Lab 01/17/13 1512 01/17/13 1957 01/18/13 0742 01/18/13 1147  GLUCAP 221* 297* 243* 279*    Microbiology: Results for orders placed during the hospital encounter of 01/17/13  URINE CULTURE     Status: None   Collection Time    01/17/13 12:10 PM      Result Value Range Status   Specimen Description URINE, CLEAN CATCH   Final   Special Requests NONE   Final   Culture  Setup Time 01/17/2013 13:00   Final   Colony Count >=100,000 COLONIES/ML   Final   Culture ESCHERICHIA COLI   Final   Report Status PENDING   Incomplete    Other results: EKG Results:  01/18/2013 Rate:  120 bpm PR:  158 ms QRS:  72 ms QTc:  438  ms EKG: sinus tachycardia, otherwise normal ECG  Imaging: Dg Chest 2 View  01/17/2013  *RADIOLOGY REPORT*  Clinical Data: Chest pain  CHEST - 2 VIEW  Comparison: 10/09/2011; 04/26/2007; chest CT - 05/09/2012  Findings: Grossly unchanged cardiac silhouette and mediastinal contours.  There is mild elevation of the right hemidiaphragm. Grossly unchanged mild diffuse thickening of the pulmonary interstitium.  No focal airspace opacity.  No pleural effusion or pneumothorax.  No definite evidence of edema.  Unchanged bones.  IMPRESSION: Chronic bronchitic change without acute cardiopulmonary disease.   Original Report Authenticated By: Tacey Ruiz, MD       Medications:    Infusions: . sodium chloride 150 mL/hr at 01/17/13 2126     Scheduled Medications: . aspirin EC  81 mg Oral Daily   . enoxaparin (LOVENOX) injection  40 mg Subcutaneous Q24H  . insulin aspart  0-9 Units Subcutaneous TID WC  . insulin glargine  10 Units Subcutaneous Daily  . living well with diabetes book   Does not apply Once  . sodium chloride  3 mL Intravenous Q12H     PRN Medications: acetaminophen, acetaminophen, nitroGLYCERIN    Assessment/ Plan:   April Smith is a 47 yo woman with no significant PMH who presented to the ED with chest pain and found to have a blood glucose of 468. Patient with newly diagnosed diabetes to receive education and further evaluation and management. Chest pain has since resolved.   1. Chest Pain: Per patient, chest pain has largely resolved, only occuring intermittently. Troponins negative. Little concern for acute coronary process with normal EKG. Questionable association with anxiety.  - starting aspirin 81 mg qd - notified patient to contact physician and return to care if initial symptoms reoccur.  - will need outpatient stress test   2. Newly diagnosed diabetes: Patient's blood glucoses slowly normalizing with sliding scale insulin, most recent 279. She reports feeling much improved without chest pain or numbness/tingling in feet. HbA1c at 15.5 indicating prolonged disease. Will need continued diabetes education and pharmacologic management. Patient has started education in house and is motivated to continue.   - Transitioning from sliding scale insulin to Lantus 10. Will discharge patient on this regimen with pens - Starting metformin 500 BID  - Discontinuing IVF  - Lipid panel notable for hyperlipidemia and hypertriglyceridemia. Starting lovastatin 10 mg. - Starting aspirin qd, lisinopril 2.5 mg, carvedilol 3.125 mg per diabetic management guidelines  - Follow-up BMP in outpatient setting  - Follow-up in IM clinic for diabetic education and continued monitoring  3. Tachycardia: Patient continues to be mildly tachycardic. Tolerated fluid repletion well.  -  Starting carvedilol as above    4. Tingling/numbness in feet, resolved: Symptoms have not reoccurred. Unlikely diabetic neuropathy given transient nature but patient will have to be evaluated for small vessels disease given A1c level. Sensation intact on examination.  -Continue monitoring for reoccurrence   5. Asymptomatic Bactiuria: Urine microscopic evaluation revealed 11-20 WBCs and many bacteria. UA negative for nitrites and leukocyte esterase. Urine culture with E. Coli. Patient only with polyuria, likely associated with hyperglycemia. Could also be associated with UTI. Given persistent elevation in blood glucose levels and consequent risk for infection will proceed with antibiotic therapy. - starting ciprofloxacin 250 mg BID - will follow-up urinary symptoms in the outpatient setting   6. FEN/GI  - modified carb diet, continuing with education in the outpatient setting.   - discontinuing IVF   7. PPx  - lovenox 40 mg qday,  discontinuing at discharge  Dispo: Patient to discharge home today with follow-up in IM clinic.   Code status: Full Code   Length of Stay: 1 days   This is a Psychologist, occupational Note.  The care of the patient was discussed with Dr. Collier Bullock and Dr. Eben Burow and the assessment and plan formulated with their assistance.  Please see their attached note or addendum for official documentation of the daily encounter.  Jiles Crocker, MS3 01/18/2013

## 2013-01-18 NOTE — Progress Notes (Addendum)
Inpatient Diabetes Program Recommendations  AACE/ADA: New Consensus Statement on Inpatient Glycemic Control (2013)  Target Ranges:  Prepandial:   less than 140 mg/dL      Peak postprandial:   less than 180 mg/dL (1-2 hours)      Critically ill patients:  140 - 180 mg/dL     Patient admitted with new onset diabetes.  Glucose 468 mg/dl on admission.  Spoke with pt about new diagnosis.  Discussed A1C results with her and explained what an A1C is, basic pathophysiology of DM Type 2, basic home care, importance of checking CBGs and maintaining good CBG control to prevent long-term and short-term complications.  Reviewed signs and symptoms of hyperglycemia and hypoglycemia.  RNs to provide ongoing basic DM education at bedside with this patient.  Have ordered educational booklet, insulin starter kit, and DM videos.  Also ordered RD consult for DM diet education.  Reminded patient that she will need to keep a record of all her blood sugars (paper or electronic) and bring her blood sugar data to all her MD appointments.  Educated patient and spouse on insulin pen use at home.  Reviewed contents of insulin flexpen starter kit.  Reviewed all steps if insulin pen including attachment of needle, 2-unit air shot, dialing up dose, giving injection, removing needle, disposal of sharps, storage of unused insulin, disposal of insulin etc.  Patient's spouse able to provide successful return demonstration.  Also reviewed troubleshooting with insulin pen.  MD to give patient Rxs for insulin pens and insulin pen needles.  Encouraged patient to work with her husband at home to become more familiar and comfortable with insulin injections at home.   Will follow. Ambrose Finland RN, MSN, CDE Diabetes Coordinator Inpatient Diabetes Program [phone]

## 2013-01-19 LAB — URINE CULTURE: Colony Count: >=100000

## 2013-01-21 NOTE — Discharge Summary (Signed)
Internal Medicine Teaching [facility] Discharge Note  Name: April Smith MRN: [id number] DOB: [birth year] 47 y.o.  Date of Admission: 01/17/2013  9:53 AM Date of Discharge: 01/18/2013 Attending Physician: Lars Mage, MD  Discharge Diagnosis: Principal Problem:   Chest pain Active Problems:   Newly diagnosed diabetes   Tachycardia   Discharge Medications:   Medication List    TAKE these medications       aspirin 81 MG EC tablet  Take 1 tablet (81 mg total) by mouth daily.     carvedilol 3.125 MG tablet  Commonly known as:  COREG  Take 1 tablet (3.125 mg total) by mouth 2 (two) times daily with a meal. For your heart     ciprofloxacin 250 MG tablet  Commonly known as:  CIPRO  Take 1 tablet (250 mg total) by mouth 2 (two) times daily.     fluticasone 50 MCG/ACT nasal spray  Commonly known as:  FLONASE  Place 2 sprays into the nose daily as needed for rhinitis.     ibuprofen 200 MG tablet  Commonly known as:  ADVIL,MOTRIN  Take 400 mg by mouth every 6 (six) hours as needed for pain.     insulin glargine 100 UNIT/ML injection  Commonly known as:  LANTUS SOLOSTAR  Inject 0.1 mLs (10 Units total) into the skin at bedtime.     Insulin Pen Needle 32G X 4 MM Misc  Commonly known as:  BD PEN NEEDLE NANO U/F  1 each by Does not apply route once.     lisinopril 2.5 MG tablet  Commonly known as:  PRINIVIL,ZESTRIL  Take 1 tablet (2.5 mg total) by mouth daily. For kidney protection     lovastatin 10 MG tablet  Commonly known as:  MEVACOR  Take 1 tablet (10 mg total) by mouth at bedtime. For cholesterol     metFORMIN 500 MG tablet  Commonly known as:  GLUCOPHAGE  Take 1 tablet (500 mg total) by mouth 2 (two) times daily with a meal.        Disposition and follow-up:   [name] was discharged from [facility] in Stable condition.  At the hospital follow up visit please address the following:  -Patient to establish care in the  [facility] -Followup newly diagnosed diabetes, A1c 15 - patient sent home on Lantus pens -Continue diabetic education and ensure that patient is able to use insulin properly -Provide patient with glucometer and testing strips with instructions on checking her blood sugars at home -Followup on chest pain and cardiac risk stratification -Followup on resolution of UTI - urine culture grew Escherichia coli, treated with ciprofloxacin  Follow-up Appointments:     Follow-up Information   Follow up with Plyler, Lupita Leash, RD On 01/25/2013. (At 1:30 pm )    Contact information:   [address] [place] [address] [phone]       Follow up with Johnette Abraham, DO On 01/29/2013. (At 2:00 pm )    Contact information:   [address] [place] [address] [phone]      Discharge Orders   Future Appointments Provider Department Dept Phone   01/25/2013 1:30 PM Cecil Cranker Plyler, RD [facility] [phone]   01/29/2013 2:00 PM Priscella Mann, DO [facility] [phone]   Future Orders Complete By Expires     Diet - low sodium heart healthy  As directed     Increase activity slowly  As directed  Consultations:  none   Procedures Performed:  Dg Chest 2 View  01/17/2013  *RADIOLOGY REPORT*  Clinical Data: Chest pain  CHEST - 2 VIEW  Comparison: 10/09/2011; 04/26/2007; chest CT - 05/09/2012  Findings: Grossly unchanged cardiac silhouette and mediastinal contours.  There is mild elevation of the right hemidiaphragm. Grossly unchanged mild diffuse thickening of the pulmonary interstitium.  No focal airspace opacity.  No pleural effusion or pneumothorax.  No definite evidence of edema.  Unchanged bones.  IMPRESSION: Chronic bronchitic change without acute cardiopulmonary disease.   Original Report Authenticated By: Tacey Ruiz, MD     Admission HPI: [name] is a 47 yo woman with no significant past medical history who  presents with a one day history of chest pain. She describes the pain as "pins and needles". It started the morning of 4/9 and continued to increase to 10/10 on pain scale. Nothing helped or worsened the pain. It does not radiate to her back or change with respirations. She reports that there was some pain to touch earlier but it now only tender. She denies associated fevers/chills, shortness of breath, headache, changes in vision, or nausea/vomiting. Pain had reduced to 4/5 after receiving a fluid bolus and aspirin in the ED. She denied experiencing any pain at the time of examination.  During this period she had also experienced tingling/numbness in her feet. She denies any recent trauma, sores or non-healing wounds. This has not happened before in that past. At time of examination she was no longer experiencing the tingling.  In the ED the patient was found to have a blood glucose of 468. The patient reports that she does not remember ever being told that she has elevated blood sugars. She denies any changes in her vision. She has been urinating frequently which she believes is related to her increased water intake with activity at her job. She denies episodes of passing out, double vision, or changes in her bowel movements.    Hospital Course by problem list: Principal Problem:   Chest pain Active Problems:   Newly diagnosed diabetes   Tachycardia   #Chest Pain: Patient presented with one day history of chest pain described as "pins and needles".  Pain reduced in intensity after administration of fluid bolus and aspirin in ED. EKG with acute changes concerning for MI, resulting in sinus tachycardia. Cardiac troponins were cycled and negative. Chest x ray on admission without focal infiltrates. D dimer negative. During hospitalization pain resolved completely with only one instance of recurrence not requiring any treatment. Patient continued on 81 mg aspirin regimen upon discharge. Will need a stress  test in the outpatient setting to evaluate for CAD.   #Newly diagnosed diabetes: Patient had hyperglycemia to 468 on admission. She was given IV fluids and insulin in the ED. Patient's blood glucoses slowly trended down with administration of sliding scale insulin and rehydration. HgA1c came back as 15.5. Review of records indicated previous elevation of A1c to 7.7 and isolated glucose in the 400s, but no formal diagnosis of diabetes and patient was unaware of her high blood sugar. Diabetic education initiated in house and patient started on diabetic diet. She was started on Lantus 10 units to be continued in the outpatient setting. Also initiated metformin, lovastatin, Lisinopril, aspirin and carvedilol per guidelines.  Patient was set up with a followup appointment in the [facility] for followup of her newly diagnosed diabetes. She will need a glucometer and test strips, a follow-up BMP, continued diabetes education, management  and monitoring. She will also need a referral to ophthalmology for a dilated eye exam.  #Tachycardia: Patient continued to be mildly tachycardic during inpatient stay. Received IV hydration and glucose management. Patient maintained good oxygen saturation and did not develop further chest pain, lightheadedness or palpitations. Suspect this was related to hyperglycemia and dehydration. Carvedilol was started per diabetic guidelines. Followup for resolution as an outpatient.   #Tingling/numbness in feet, resolved: Symptoms only occurred day of presentation. Concern for diabetic neuropathy given elevation in A1c, however, the acute onset is not consistent with neuropathy. The symptoms resolved on admission. Patient to receive evaluation of small vessel disease with initial diabetic work-up.    #UTI Urine microscopic evaluation revealed 11-20 WBCs and many bacteria. UA negative for nitrites and leukocyte esterase. Urine culture grew E. Coli. Patient denied dysuria, however, she did have some  urinary frequency and it was unclear whether this was from hyperglycemia and glucosuria or urinary tract infection. Patient was given a prescription for a short course of ciprofloxacin. Will need to follow-up urinary symptoms at follow-up appointment.     Discharge Vitals:  BP 118/71  Pulse 98  Temp(Src) 98.6 F (37 C) (Oral)  Resp 18  Ht 5\' 2"  (1.575 m)  Wt 203 lb (92.08 kg)  BMI 37.12 kg/m2  SpO2 100%  LMP 12/29/2012   Signed: Denton Ar 01/21/2013, 11:35 AM   Time Spent on Discharge:  35 minutes Services Ordered on Discharge:  None Equipment Ordered on Discharge:  None

## 2013-01-25 ENCOUNTER — Ambulatory Visit (INDEPENDENT_AMBULATORY_CARE_PROVIDER_SITE_OTHER): Payer: Self-pay | Admitting: Dietician

## 2013-01-25 DIAGNOSIS — E119 Type 2 diabetes mellitus without complications: Secondary | ICD-10-CM

## 2013-01-25 NOTE — Patient Instructions (Addendum)
The meter you liked is the One touch Verio, your blood sugar today was 248 fasting( you had not eaten anything all day) .Marland Kitchen  I will request a referral to [facility] [phone].  You said you are going to limit your carbohydrates in the next few weeks to improve your blood sugar.   Try to get in the habit of checkng your feet every day.

## 2013-01-25 NOTE — Progress Notes (Signed)
Medical Nutrition Therapy:  Appt start time: 1330 end time:  1430.  Assessment:  Primary concerns today: Weight management, Blood sugar control and Meal planning.  Patient is here with husband, Gregary Signs after being in hospital with new diagnosis of diabetes. They have many questions and desire to attend classes for diabetes education and  Training. She is taking and tolerating her medications, but husband is giving insulin injections.  Usual eating pattern includes:  meals and snacks between meals. Frequent foods include regular soda, snack foods, sweets in past but is trying to drink more diet now and eat healthier.  Physical activity not assessed today. Labs reviewed with A1C 15 and BMI-37   Progress Towards Goal(s):  In progress.   Nutritional Diagnosis:  NB-1.1 Food and nutrition-related knowledge deficit As related to lack of prior exposure to diabetes training.  As evidenced by her report and questions.    Intervention:  Nutrition education: Answered their questions and concerns about diabetes Nutrition education: began meal planning with carb counting.  Nutrtion education- taught self monitoring with One touch verio- patient blood sugar was 248 and she reports this is fasting Coordiantion of care- patient wanted to continue trying decrease carbs in her diet to decrease blood sugars (offered to request increased insulin dose) , will request Referral for DSMT to [facility]  and MNT for our office  Monitoring/Evaluation:  Dietary intake, exercise, meter and hopefully blood sugars, and body weight in 3 week(s).

## 2013-01-29 ENCOUNTER — Encounter: Payer: Self-pay | Admitting: Internal Medicine

## 2013-01-29 ENCOUNTER — Ambulatory Visit (INDEPENDENT_AMBULATORY_CARE_PROVIDER_SITE_OTHER): Payer: Self-pay | Admitting: Internal Medicine

## 2013-01-29 VITALS — BP 136/94 | HR 100 | Temp 97.9°F | Ht 62.0 in | Wt 212.0 lb

## 2013-01-29 DIAGNOSIS — E1165 Type 2 diabetes mellitus with hyperglycemia: Secondary | ICD-10-CM

## 2013-01-29 DIAGNOSIS — N390 Urinary tract infection, site not specified: Secondary | ICD-10-CM

## 2013-01-29 DIAGNOSIS — R079 Chest pain, unspecified: Secondary | ICD-10-CM

## 2013-01-29 DIAGNOSIS — Z598 Other problems related to housing and economic circumstances: Secondary | ICD-10-CM | POA: Insufficient documentation

## 2013-01-29 MED ORDER — CARVEDILOL 3.125 MG PO TABS: 3.1250 mg | ORAL_TABLET | Freq: Two times a day (BID) | ORAL | Status: DC

## 2013-01-29 MED ORDER — METFORMIN HCL 500 MG PO TABS: 500.0000 mg | ORAL_TABLET | Freq: Two times a day (BID) | ORAL | Status: DC

## 2013-01-29 MED ORDER — LISINOPRIL 2.5 MG PO TABS: 2.5000 mg | ORAL_TABLET | Freq: Every day | ORAL | Status: DC

## 2013-01-29 MED ORDER — LOVASTATIN 10 MG PO TABS: 10.0000 mg | ORAL_TABLET | Freq: Every day | ORAL | Status: DC

## 2013-01-29 MED ORDER — INSULIN GLARGINE 100 UNIT/ML ~~LOC~~ SOLN: 10.0000 [IU] | Freq: Every day | SUBCUTANEOUS | Status: DC

## 2013-01-29 NOTE — Progress Notes (Signed)
Asked to see patient today because she can not afford a meter. Today she tells me that she does not have insurance to cover some of the cost of meter supplies and diabetes classes yet and is hoping her Medicaid comes through.  Discussed options and cost of  meter at health department if she gets orange card vs getting one OTC at [facility]. Patient verbalized understanding.suggested she also find out cost of classes at [facility] if she does not have insurance pr orange card prior to attending them.

## 2013-01-29 NOTE — Progress Notes (Signed)
Patient: April Smith   MRN: [id number]  DOB: 07/22/1966  PCP: None  Subjective:    CC: Follow-up and Diabetes   HPI: Ms. Anyi Fels is a 47 y.o. female with a PMHx of as outlined below, who presented to clinic today to establish care with a new PCP and for the following:  1) Hospital follow-up - Patient was hospitalized at [facility] from April 9 - 10, 2014 for evaluation of the following:  Atypical Chest pain - pt was admitted with symptoms of pins and needles in her left chest without radiation, which was reduced in intensity after IVF and aspirin in ED. EKG was not concerning for acute MI, additionally, cardiac enzyme were cycled x 3 and found to be negative. The patient was started on aspirin 81mg  daily and is to be scheduled for outpatient stress test after establishing care with a new PCP. Since hospital discharge, patient indicates 1 additional similar episode of pain that awoke her from her sleep. Still pins and needles sensation with brief associated SOB but without associated nausea/vomiting/ sweating. Lasted for 15 minutes and resolved spontaneously. is taking recommended medications regularly. Today, the patient has not had the symptoms recur.    Newly diagnosed type 2 diabetes, uncontrolled (A1c 15) - during risk stratification for her chest pain, the patient was newly diagnosed with very uncontrolled type 2 diabetes. She was treated with IV fluids and started on metformin and Lantus 10 units once daily. However, patient was unable to afford the medications - therefore, she has not had any treatment since hospital. Confirms increased thirst, but without polyuria, nausea, vomiting, diarrhea.   Escherichia coli UTI - during the hospital course, urine microscopy revealed 11-20 WBCs and many bacteria. The patient was prescribed ciprofloxacin x3 days, to be completed at discharge. Today, the patient confirms none. Otherwise, the patient denies dysuria, urinary frequency, urinary  incontinence and urinary urgency.   Review of Systems: Per HPI    Current Outpatient Medications: Is not taking any of these medication except Aspirin 81mg  - has been unable to afford. Medication Sig  . aspirin EC 81 MG EC tablet Take 1 tablet (81 mg total) by mouth daily.  . carvedilol (COREG) 3.125 MG tablet Take 1 tablet (3.125 mg total) by mouth 2 (two) times daily with a meal. For your heart  . ciprofloxacin (CIPRO) 250 MG tablet Take 1 tablet (250 mg total) by mouth 2 (two) times daily.  . fluticasone (FLONASE) 50 MCG/ACT nasal spray Place 2 sprays into the nose daily as needed for rhinitis.  Marland Kitchen ibuprofen (ADVIL,MOTRIN) 200 MG tablet Take 400 mg by mouth every 6 (six) hours as needed for pain.   Marland Kitchen insulin glargine (LANTUS SOLOSTAR) 100 UNIT/ML injection Inject 0.1 mLs (10 Units total) into the skin at bedtime.  . Insulin Pen Needle (BD PEN NEEDLE NANO U/F) 32G X 4 MM MISC 1 each by Does not apply route once.  Marland Kitchen lisinopril (PRINIVIL,ZESTRIL) 2.5 MG tablet Take 1 tablet (2.5 mg total) by mouth daily. For kidney protection  . lovastatin (MEVACOR) 10 MG tablet Take 1 tablet (10 mg total) by mouth at bedtime. For cholesterol  . metFORMIN (GLUCOPHAGE) 500 MG tablet Take 1 tablet (500 mg total) by mouth 2 (two) times daily with a meal.    Allergies  Allergen Reactions  . Penicillins Swelling    Shortness of breath, rash    Past Medical History  Diagnosis Date  . Diabetes mellitus type 2, uncontrolled 01/17/2013    Past Surgical  History  Procedure Laterality Date  . No past surgeries      Family History  Problem Relation Age of Onset  . Kawasaki disease Son      Social History History   Social History  . Marital Status: Single    Spouse Name: N/A    Number of Children: 4  . Years of Education: 12th grade   Occupational History  . House cleaning    Social History Main Topics  . Smoking status: Never Smoker   . Smokeless tobacco: None  . Alcohol Use: No  . Drug  Use: No  . Sexually Active:    Other Topics Concern  . None   Social History Narrative   Lives in [place], [place] with her 4 children     Objective:    Physical Exam: Filed Vitals:   01/29/13 1403  BP: 136/94  Pulse: 100  Temp: 97.9 F (36.6 C)     General: Vital signs reviewed and noted. Well-developed, well-nourished, in no acute distress; alert, appropriate and cooperative throughout examination.  Head: Normocephalic, atraumatic.  Eyes: conjunctivae/corneas clear. PERRL, EOM's intact.   Ears: TM nonerythematous, not bulging, good light reflex bilaterally.  Nose: Mucous membranes moist, not inflammed, nonerythematous.  Throat: Oropharynx nonerythematous, no exudate appreciated.   Neck: No deformities, masses, or tenderness noted.  Lungs:  Normal respiratory effort. Clear to auscultation BL without crackles or wheezes.  Heart: Tachycardic, regular rhythm. S1 and S2 normal without gallop, rubs. No murmur.  Abdomen:  BS normoactive. Soft, Nondistended, non-tender.  No masses or organomegaly.  Extremities: No pretibial edema.     Assessment/ Plan:   The patient's case and plan of care was discussed with attending physician, Dr. Lars Mage.

## 2013-01-29 NOTE — Assessment & Plan Note (Addendum)
Assessment: The patient was experiencing atypical chest pain during hospital course. She has had one similar episode approximately 2 nights ago that lasted approximately 15 minutes and then self resolved - again, very atypical in nature. Today, the patient is without any symptoms of chest pain or palpitations.  Plan:      Continue aspirin 81mg  daily.  Restart Coreg 3.25 mg BID and Lovastatin 10mg  daily.  Plan for cardiology referral for outpatient stress test once patient gets orange card versus Medicaid. The patient was informed of the red flag symptoms that should prompt her immediate return for reevaluation - such as, but not limited to the following: worsening chest pain, abdominal pain, shortness of breath, N/V. She verbally expresses understanding of the information provided.

## 2013-01-29 NOTE — Assessment & Plan Note (Signed)
Pertinent Labs: Lab Results  Component Value Date   HGBA1C 15.0* 01/17/2013   CREATININE 0.74 01/18/2013   CHOL 141 01/18/2013   HDL 36* 01/18/2013   TRIG 309* 01/18/2013    Assessment: Disease Control: poor control (HgbA1C >9%)  Progress toward goals: unable to assess  Barriers to meeting goals: financial need    The patient has not yet started her Metformin or Lantus - states she was told at the pharmacy that total cost of all meds were $500 and they would not separately disclose the cost. Therefore, she is not taking any medication except aspirin at this point.  Plan: Glucometer log was not reviewed today, as pt did not have glucometer available for review.   Refill of Metformin given - instructed to attempt to get a [employer] given of Lantus - to inject 10 units qHS.  Asking Lupita Leash to discuss meter with the patient and start checking BID before breakfast and before bedtime and PRN.  reminded to bring blood glucose meter & log to each visit  Educational resources provided: referral to diabetes education class  Self management tools provided: home glucose logbook;instructions for home glucose monitoring;home glucose testing supplies  Home glucose monitoring recommendation: 2 times a day before breakfast;at bedtime

## 2013-01-29 NOTE — Assessment & Plan Note (Signed)
Assessment: The patient has been unable to afford any of her medications secondary to cost. States that at the pharmacy they would only tell her that she would've cost of all medications together, therefore, she was not able to take any of the medications. We discussed her necessary medications today, which should amount less than $20 (excluding the Lantus Solostar sample that was given today). She states she should be able to afford this. States she is in the process of applying for [employer], but may otherwise be able to apply for orange card.  Plan:      Follow-up with Chauncey Reading to assess if meets eligibility for orange card.

## 2013-01-29 NOTE — Patient Instructions (Signed)
General Instructions:  Please follow-up at the clinic in 2  weeks, at which time we will reevaluate your diabetes and blood pressure - OR, please follow-up in the clinic sooner if needed.  There have been changes in your medications:  START Carvedilol, Lisinopril, Metformin, and Lovastatin   Start checking your blood sugars 2 times daily before breakfast and before bedtime.  If you have been started on new medication(s), and you develop symptoms concerning for allergic reaction, including, but not limited to, throat closing, tongue swelling, rash, please stop the medication immediately and call the clinic at [phone], and go to the ER.  If you are diabetic, please bring your meter to your next visit.  If symptoms worsen, or new symptoms arise, please call the clinic or go to the ER.  PLEASE BRING ALL OF YOUR MEDICATIONS  IN A BAG TO YOUR NEXT APPOINTMENT   Treatment Goals:  Goals (1 Years of Data) as of 01/29/13         As of Today 01/18/13 01/17/13 01/17/13 01/17/13     Blood Pressure    . Blood Pressure < 140/90  136/94 118/71 117/63 143/90 133/89     Result Component    . HEMOGLOBIN A1C < 7.0    15.0      . LDL CALC < 100   43         Progress Toward Treatment Goals:  Treatment Goal 01/29/2013  Hemoglobin A1C unable to assess    Self Care Goals & Plans:  Self Care Goal 01/29/2013  Manage my medications take my medicines as prescribed; refill my medications on time  Monitor my health keep track of my blood glucose; bring my glucose meter and log to each visit  Eat healthy foods eat more vegetables; eat foods that are low in salt  Be physically active park at the far end of the parking lot    Home Blood Glucose Monitoring 01/29/2013  Check my blood sugar 2 times a day  When to check my blood sugar before breakfast; at bedtime     Care Management & Community Referrals:  Referral 01/29/2013  Referrals made for care management support diabetes educator        1800  Calorie Diet for Diabetes Meal Planning The 1800 calorie diet is designed for eating up to 1800 calories each day. Following this diet and making healthy meal choices can help improve overall health. This diet controls blood sugar (glucose) levels and can also help lower blood pressure and cholesterol. SERVING SIZES Measuring foods and serving sizes helps to make sure you are getting the right amount of food. The list below tells how big or small some common serving sizes are:  1 oz.........4 stacked dice.  3 oz........Marland KitchenDeck of cards.  1 tsp.......Marland KitchenTip of little finger.  1 tbs......Marland KitchenMarland KitchenThumb.  2 tbs.......Marland KitchenGolf ball.   cup......Marland KitchenHalf of a fist.  1 cup.......Marland KitchenA fist. GUIDELINES FOR CHOOSING FOODS The goal of this diet is to eat a variety of foods and limit calories to 1800 each day. This can be done by choosing foods that are low in calories and fat. The diet also suggests eating small amounts of food frequently. Doing this helps control your blood glucose levels so they do not get too high or too low. Each meal or snack may include a protein food source to help you feel more satisfied and to stabilize your blood glucose. Try to eat about the same amount of food around the same time each day. This  includes weekend days, travel days, and days off work. Space your meals about 4 to 5 hours apart and add a snack between them if you wish.  For example, a daily food plan could include breakfast, a morning snack, lunch, dinner, and an evening snack. Healthy meals and snacks include whole grains, vegetables, fruits, lean meats, poultry, fish, and dairy products. As you plan your meals, select a variety of foods. Choose from the bread and starch, vegetable, fruit, dairy, and meat/protein groups. Examples of foods from each group and their suggested serving sizes are listed below. Use measuring cups and spoons to become familiar with what a healthy portion looks like. Bread and Starch Each serving equals  15 grams of carbohydrates.  1 slice bread.   bagel.   cup cold cereal (unsweetened).   cup hot cereal or mashed potatoes.  1 small potato (size of a computer mouse).   cup cooked pasta or rice.   English muffin.  1 cup broth-based soup.  3 cups of popcorn.  4 to 6 whole-wheat crackers.   cup cooked beans, peas, or corn. Vegetable Each serving equals 5 grams of carbohydrates.   cup cooked vegetables.  1 cup raw vegetables.   cup tomato or vegetable juice. Fruit Each serving equals 15 grams of carbohydrates.  1 small apple or orange.  1 cup watermelon or strawberries.   cup applesauce (no sugar added).  2 tbs raisins.   banana.   cup canned fruit, packed in water, its own juice, or sweetened with a sugar substitute.   cup unsweetened fruit juice. Dairy Each serving equals 12 to 15 grams of carbohydrates.  1 cup fat-free milk.  6 oz artificially sweetened yogurt or plain yogurt.  1 cup low-fat buttermilk.  1 cup soy milk.  1 cup almond milk. Meat/Protein  1 large egg.  2 to 3 oz meat, poultry, or fish.   cup low-fat cottage cheese.  1 tbs peanut butter.  1 oz low-fat cheese.   cup tuna in water.   cup tofu. Fat  1 tsp oil.  1 tsp trans-fat-free margarine.  1 tsp butter.  1 tsp mayonnaise.  2 tbs avocado.  1 tbs salad dressing.  1 tbs cream cheese.  2 tbs sour cream. SAMPLE 1800 CALORIE DIET PLAN Breakfast   cup unsweetened cereal (1 carb serving).  1 cup fat-free milk (1 carb serving).  1 slice whole-wheat toast (1 carb serving).   small banana (1 carb serving).  1 scrambled egg.  1 tsp trans-fat-free margarine. Lunch  Tuna sandwich.  2 slices whole-wheat bread (2 carb servings).   cup canned tuna in water, drained.  1 tbs reduced fat mayonnaise.  1 stalk celery, chopped.  2 slices tomato.  1 lettuce leaf.  1 cup carrot sticks.  24 to 30 seedless grapes (2 carb servings).  6 oz  light yogurt (1 carb serving). Afternoon Snack  3 graham cracker squares (1 carb serving).  Fat-free milk, 1 cup (1 carb serving).  1 tbs peanut butter. Dinner  3 oz salmon, broiled with 1 tsp oil.  1 cup mashed potatoes (2 carb servings) with 1 tsp trans-fat-free margarine.  1 cup fresh or frozen green beans.  1 cup steamed asparagus.  1 cup fat-free milk (1 carb serving). Evening Snack  3 cups air-popped popcorn (1 carb serving).  2 tbs parmesan cheese sprinkled on top. MEAL PLAN Use this worksheet to help you make a daily meal plan based on the 1800 calorie diet suggestions.  If you are using this plan to help you control your blood glucose, you may interchange carbohydrate-containing foods (dairy, starches, and fruits). Select a variety of fresh foods of varying colors and flavors. The total amount of carbohydrate in your meals or snacks is more important than making sure you include all of the food groups every time you eat. Choose from the following foods to build your day's meals:  8 Starches.  4 Vegetables.  3 Fruits.  2 Dairy.  6 to 7 oz Meat/Protein.  Up to 4 Fats. Your dietician can use this worksheet to help you decide how many servings and which types of foods are right for you. BREAKFAST Food Group and Servings / Food Choice Starch ________________________________________________________ Dairy _________________________________________________________ Fruit _________________________________________________________ Meat/Protein __________________________________________________ Fat ___________________________________________________________ LUNCH Food Group and Servings / Food Choice Starch ________________________________________________________ Meat/Protein __________________________________________________ Vegetable _____________________________________________________ Fruit _________________________________________________________ Dairy  _________________________________________________________ Fat ___________________________________________________________ Aura Fey Food Group and Servings / Food Choice Starch ________________________________________________________ Meat/Protein __________________________________________________ Fruit __________________________________________________________ Dairy _________________________________________________________ Laural Golden Food Group and Servings / Food Choice Starch _________________________________________________________ Meat/Protein ___________________________________________________ Dairy __________________________________________________________ Vegetable ______________________________________________________ Fruit ___________________________________________________________ Fat ____________________________________________________________ Lollie Sails Food Group and Servings / Food Choice Fruit __________________________________________________________ Meat/Protein ___________________________________________________ Dairy __________________________________________________________ Starch _________________________________________________________ DAILY TOTALS Starch ____________________________ Vegetable _________________________ Fruit _____________________________ Dairy _____________________________ Meat/Protein______________________ Fat _______________________________ Document Released: 04/19/2005 Document Revised: 12/20/2011 Document Reviewed: 08/13/2011 Exit[employer] Patient Information 2013 [employer], [place].

## 2013-01-29 NOTE — Progress Notes (Signed)
Case discussed with Dr. Kalia-Reynolds at the time of the visit, immediately after the resident saw the patient.  I reviewed the resident's history and exam and pertinent patient test results.  I agree with the assessment, diagnosis and plan of care documented in the resident's note.   

## 2013-01-29 NOTE — Assessment & Plan Note (Signed)
Assessment: The patient was diagnosed with asymptomatic Escherichia coli UTI during hospital course. She was initially planned to continue ciprofloxacin. However, she never was able to fill the prescription. She remains completely asymptomatic today, therefore I do not feel inclined to treat her with antibiotics.  Plan:      Continue to monitor for symptoms.

## 2013-02-08 ENCOUNTER — Telehealth: Payer: Self-pay | Admitting: *Deleted

## 2013-02-08 NOTE — Telephone Encounter (Signed)
I talked with Dr Aundria Rud and Dr Saralyn Pilar and both advised ED for evaluation. Pt called and voices understanding.  Her last cbg was 240 in AM.

## 2013-02-08 NOTE — Telephone Encounter (Signed)
Pt called with c/o blurred vision, and headache for 2 days.  She rates the headache at 10/10, at back of head with radiation to from of head. Constant.  She took ASA, with no relief.  She has only slept about 1 hour. Denies nausea, is able to eat some fruit.  Pt has been on all new medication for 1 week.   Pt # [id number]

## 2013-02-10 ENCOUNTER — Emergency Department (HOSPITAL_COMMUNITY): Payer: Self-pay

## 2013-02-10 ENCOUNTER — Encounter (HOSPITAL_COMMUNITY): Payer: Self-pay | Admitting: Emergency Medicine

## 2013-02-10 ENCOUNTER — Emergency Department (HOSPITAL_COMMUNITY)
Admission: EM | Admit: 2013-02-10 | Discharge: 2013-02-10 | Disposition: A | Discharge status: Home / Self Care | Payer: Self-pay | Attending: Emergency Medicine | Admitting: Emergency Medicine

## 2013-02-10 DIAGNOSIS — R51 Headache: Secondary | ICD-10-CM | POA: Insufficient documentation

## 2013-02-10 DIAGNOSIS — Z7982 Long term (current) use of aspirin: Secondary | ICD-10-CM | POA: Insufficient documentation

## 2013-02-10 DIAGNOSIS — Z79899 Other long term (current) drug therapy: Secondary | ICD-10-CM | POA: Insufficient documentation

## 2013-02-10 DIAGNOSIS — IMO0001 Reserved for inherently not codable concepts without codable children: Secondary | ICD-10-CM | POA: Insufficient documentation

## 2013-02-10 DIAGNOSIS — R450 Nervousness: Secondary | ICD-10-CM | POA: Insufficient documentation

## 2013-02-10 DIAGNOSIS — Z794 Long term (current) use of insulin: Secondary | ICD-10-CM | POA: Insufficient documentation

## 2013-02-10 DIAGNOSIS — R110 Nausea: Secondary | ICD-10-CM | POA: Insufficient documentation

## 2013-02-10 DIAGNOSIS — R0602 Shortness of breath: Secondary | ICD-10-CM | POA: Insufficient documentation

## 2013-02-10 DIAGNOSIS — R079 Chest pain, unspecified: Secondary | ICD-10-CM

## 2013-02-10 LAB — POCT I-STAT TROPONIN I

## 2013-02-10 LAB — GLUCOSE, CAPILLARY: Glucose-Capillary: 208 mg/dL — ABNORMAL HIGH (ref 70–99)

## 2013-02-10 LAB — CBC
HCT: 39.2 % (ref 36.0–46.0)
Hemoglobin: 13.7 g/dL (ref 12.0–15.0)
MCH: 28.1 pg (ref 26.0–34.0)
MCHC: 34.9 g/dL (ref 30.0–36.0)
MCV: 80.5 fL (ref 78.0–100.0)
RBC: 4.87 MIL/uL (ref 3.87–5.11)

## 2013-02-10 LAB — POCT I-STAT, CHEM 8
BUN: 13 mg/dL (ref 6–23)
Creatinine, Ser: 0.6 mg/dL (ref 0.50–1.10)
Glucose, Bld: 294 mg/dL — ABNORMAL HIGH (ref 70–99)
Hemoglobin: 15 g/dL (ref 12.0–15.0)
Potassium: 4.1 mEq/L (ref 3.5–5.1)

## 2013-02-10 MED ORDER — LORAZEPAM 2 MG/ML IJ SOLN
1.0000 mg | Freq: Once | INTRAMUSCULAR | Status: AC
  Administered 2013-02-10: 1 mg via INTRAVENOUS
  Filled 2013-02-10: qty 1

## 2013-02-10 MED ORDER — SODIUM CHLORIDE 0.9 % IV BOLUS (SEPSIS)
1000.0000 mL | Freq: Once | INTRAVENOUS | Status: AC
  Administered 2013-02-10: 1000 mL via INTRAVENOUS

## 2013-02-10 MED ORDER — DIPHENHYDRAMINE HCL 50 MG/ML IJ SOLN
25.0000 mg | Freq: Once | INTRAMUSCULAR | Status: AC
  Administered 2013-02-10: 25 mg via INTRAVENOUS
  Filled 2013-02-10: qty 1

## 2013-02-10 MED ORDER — INSULIN ASPART 100 UNIT/ML ~~LOC~~ SOLN: 9.0000 [IU] | Freq: Once | SUBCUTANEOUS | Status: DC

## 2013-02-10 MED ORDER — METOCLOPRAMIDE HCL 5 MG/ML IJ SOLN
10.0000 mg | Freq: Once | INTRAMUSCULAR | Status: AC
  Administered 2013-02-10: 10 mg via INTRAVENOUS
  Filled 2013-02-10: qty 2

## 2013-02-10 NOTE — ED Notes (Signed)
Pt reports chest pain that started last night but worse today with some nausea. NAD.

## 2013-02-10 NOTE — ED Provider Notes (Signed)
History     CSN: [id number]  Arrival date & time 02/10/13  1857   First MD Initiated Contact with Patient 02/10/13 1900      Chief Complaint  Patient presents with  . Chest Pain    (Consider location/radiation/quality/duration/timing/severity/associated sxs/prior treatment) Patient is a 47 y.o. female presenting with chest pain. The history is provided by the patient.  Chest Pain Pain location:  L chest Pain quality: sharp, shooting and stabbing   Pain quality comment:  "pins and needles" Pain radiates to:  Does not radiate Pain radiates to the back: no   Pain severity:  Mild Onset quality:  Gradual Duration:  1 day Timing:  Constant Progression:  Waxing and waning Chronicity:  Recurrent (had similar episode last month; found to have hyperglycemia at that time) Relieved by:  Nothing Associated symptoms: anxiety, headache, nausea and shortness of breath   Associated symptoms: no abdominal pain, no cough, no dizziness, no fever, no numbness, no palpitations, not vomiting and no weakness   Risk factors: no coronary artery disease, no smoking and no surgery     Past Medical History  Diagnosis Date  . Diabetes mellitus type 2, uncontrolled 01/17/2013    Past Surgical History  Procedure Laterality Date  . No past surgeries      Family History  Problem Relation Age of Onset  . Kawasaki disease Son     History  Substance Use Topics  . Smoking status: Never Smoker   . Smokeless tobacco: Not on file  . Alcohol Use: No    OB History   Grav Para Term Preterm Abortions TAB SAB Ect Mult Living                  Review of Systems  Constitutional: Negative for fever, chills, activity change and appetite change.  HENT: Negative for neck pain and neck stiffness.   Respiratory: Positive for shortness of breath. Negative for cough, chest tightness and wheezing.   Cardiovascular: Positive for chest pain. Negative for palpitations.  Gastrointestinal: Positive for nausea.  Negative for vomiting, abdominal pain, diarrhea and constipation.  Genitourinary: Negative for dysuria, decreased urine volume and difficulty urinating.  Skin: Negative for rash and wound.  Neurological: Positive for headaches. Negative for dizziness, seizures, syncope, facial asymmetry, speech difficulty, weakness, light-headedness and numbness.  Psychiatric/Behavioral: Negative for confusion and agitation. The patient is nervous/anxious.   All other systems reviewed and are negative.    Allergies  Penicillins  Home Medications   Current Outpatient Rx  Name  Route  Sig  Dispense  Refill  . aspirin EC 81 MG EC tablet   Oral   Take 1 tablet (81 mg total) by mouth daily.         . carvedilol (COREG) 3.125 MG tablet   Oral   Take 1 tablet (3.125 mg total) by mouth 2 (two) times daily with a meal. For your heart   60 tablet   1   . fluticasone (FLONASE) 50 MCG/ACT nasal spray   Nasal   Place 2 sprays into the nose daily as needed for rhinitis.         Marland Kitchen ibuprofen (ADVIL,MOTRIN) 200 MG tablet   Oral   Take 400 mg by mouth every 6 (six) hours as needed for pain.          Marland Kitchen insulin glargine (LANTUS SOLOSTAR) 100 UNIT/ML injection   Subcutaneous   Inject 0.1 mLs (10 Units total) into the skin at bedtime. Medication Samples have been  provided to the patient. Drug name: Lantus Solostar Pen  Qty: 1  LOT: [id number]  Exp.Date: 12/2014 The patient has been instructed regarding the correct time, dose, and frequency of taking this medication, including desired effects and most common side effects.   10 mL   4   . lisinopril (PRINIVIL,ZESTRIL) 2.5 MG tablet   Oral   Take 1 tablet (2.5 mg total) by mouth daily. For kidney protection   30 tablet   1   . lovastatin (MEVACOR) 10 MG tablet   Oral   Take 1 tablet (10 mg total) by mouth at bedtime. For cholesterol   30 tablet   1   . metFORMIN (GLUCOPHAGE) 500 MG tablet   Oral   Take 1 tablet (500 mg total) by mouth 2 (two) times  daily with a meal.   60 tablet   0   . Insulin Pen Needle (BD PEN NEEDLE NANO U/F) 32G X 4 MM MISC   Does not apply   1 each by Does not apply route once.   50 each   4     BP 150/87  Pulse 114  Temp(Src) 99.1 F (37.3 C) (Oral)  Ht 5\' 2"  (1.575 m)  Wt 203 lb (92.08 kg)  BMI 37.12 kg/m2  SpO2 100%  LMP 01/23/2013  Physical Exam  Nursing note and vitals reviewed. Constitutional: She is oriented to person, place, and time. She appears well-developed and well-nourished.  HENT:  Head: Normocephalic and atraumatic.  Right Ear: External ear normal.  Left Ear: External ear normal.  Nose: Nose normal.  Mouth/Throat: Oropharynx is clear and moist. No oropharyngeal exudate.  Eyes: Conjunctivae are normal. Pupils are equal, round, and reactive to light.  Neck: Normal range of motion. Neck supple.  Cardiovascular: Normal rate, regular rhythm, normal heart sounds and intact distal pulses.  Exam reveals no gallop and no friction rub.   No murmur heard. Pulmonary/Chest: Effort normal and breath sounds normal. No respiratory distress. She has no wheezes. She has no rales. She exhibits tenderness (left lateral chest).  Abdominal: Soft. Bowel sounds are normal. She exhibits no distension and no mass. There is no tenderness. There is no rebound and no guarding.  Musculoskeletal: Normal range of motion. She exhibits no edema and no tenderness.  Neurological: She is alert and oriented to person, place, and time. She displays normal reflexes. No cranial nerve deficit. She exhibits normal muscle tone. Coordination normal.  Skin: Skin is warm and dry.  Psychiatric: She has a normal mood and affect. Her behavior is normal. Judgment and thought content normal.    ED Course  Procedures (including critical care time)  Labs Reviewed  GLUCOSE, CAPILLARY - Abnormal; Notable for the following:    Glucose-Capillary 208 (*)    All other components within normal limits  POCT I-STAT, CHEM 8 - Abnormal;  Notable for the following:    Glucose, Bld 294 (*)    Calcium, Ion 1.27 (*)    All other components within normal limits  CBC  POCT I-STAT TROPONIN I  POCT I-STAT TROPONIN I   No results found.   Date: 02/10/2013  Rate: 109 bpm  Rhythm: sinus tachycardia  QRS Axis: normal  Intervals: normal  ST/T Wave abnormalities: normal  Conduction Disutrbances:none  Narrative Interpretation: Sinus tachycardia; rate decreased from 120 bpm on previous EKG  Old EKG Reviewed: unchanged and changes noted    1. Diabetes mellitus, insulin dependent (IDDM), uncontrolled   2. Chest pain  MDM  47 yo F w/hx of recent diagnosis of insulin-dependent diabetes mellitus (found incidentally on work-up for chest pain last month) presents for left-sided pleuritic "pins and needles" chest pain and associated dyspnea. Pt also with paresthesias in bilateral hands, similar to last month's visit. Suspect symptoms secondary to anxiety. Pain reproducible with palpation. EKG not c/w acute ischemia or arrythmia. Work-up reveals hyperglycemia; treated with IVF and improvement of blood glucose to 208. Pt's symptoms resolved with ativan and the fluids. Serial troponins negative and d-dimer also negative. Clinical picture not consistent with ACS, PE, pneumonia, pneumothorax, or dissection. HR decreased from 109 bpm on arrival to the 80's at discharge. Patient given return precautions, including worsening of signs or symptoms. Patient instructed to follow-up with primary care physician.          Clemetine Marker, MD 02/10/13 [phone]

## 2013-02-10 NOTE — ED Notes (Signed)
Pt returned from radiology.

## 2013-02-12 NOTE — ED Provider Notes (Signed)
I saw and evaluated the patient, reviewed the resident's note and I agree with the findings and plan.   .Face to face Exam:  General:  Awake HEENT:  Atraumatic Resp:  Normal effort Abd:  Nondistended Neuro:No focal weakness Lymph: No adenopathy  Nelia Shi, MD 02/12/13 [phone]

## 2013-02-15 ENCOUNTER — Encounter: Payer: Self-pay | Admitting: Internal Medicine

## 2013-02-15 ENCOUNTER — Ambulatory Visit (INDEPENDENT_AMBULATORY_CARE_PROVIDER_SITE_OTHER): Payer: Self-pay | Admitting: Internal Medicine

## 2013-02-15 VITALS — BP 136/89 | HR 94 | Temp 98.0°F | Ht 63.5 in | Wt 214.7 lb

## 2013-02-15 DIAGNOSIS — H538 Other visual disturbances: Secondary | ICD-10-CM

## 2013-02-15 DIAGNOSIS — R519 Headache, unspecified: Secondary | ICD-10-CM | POA: Insufficient documentation

## 2013-02-15 DIAGNOSIS — R51 Headache: Secondary | ICD-10-CM

## 2013-02-15 DIAGNOSIS — R079 Chest pain, unspecified: Secondary | ICD-10-CM

## 2013-02-15 DIAGNOSIS — I10 Essential (primary) hypertension: Secondary | ICD-10-CM

## 2013-02-15 DIAGNOSIS — E1165 Type 2 diabetes mellitus with hyperglycemia: Secondary | ICD-10-CM

## 2013-02-15 MED ORDER — INSULIN GLARGINE 100 UNIT/ML ~~LOC~~ SOLN: 14.0000 [IU] | Freq: Every day | SUBCUTANEOUS | Status: DC

## 2013-02-15 MED ORDER — METFORMIN HCL 500 MG PO TABS: ORAL_TABLET | ORAL | Status: DC

## 2013-02-15 NOTE — Assessment & Plan Note (Signed)
Assessment: The patient was experiencing atypical chest pain in April 2014 during hospital course. She has had one similar episode on 5/3 that was evaluated in the ED and thought 2/2 anxiety (with no new EKG changes) and troponin I neg x 1.   Plan:      Continue aspirin 81mg  daily.  Continue Coreg 3.25 mg BID and Lovastatin 10mg  daily (started during our last visit 4/21).  Plan for cardiology referral for outpatient stress test once patient gets orange card versus Medicaid. The patient was informed of the red flag symptoms that should prompt her immediate return for reevaluation - such as, but not limited to the following: worsening chest pain, abdominal pain, shortness of breath, N/V. She verbally expresses understanding of the information provided.

## 2013-02-15 NOTE — Assessment & Plan Note (Signed)
Assessment: Patient experienced severe headache and blurred vision on 5/1 with complete resolution (and no recurrence) of symptoms after receiving Ativan in the ED on 5/3. She has no neurologic deficits on exam today.   Plan:      Continue to monitor for now.  Needs ophtho referral after getting her Medicaid. The patient was informed of the red flag symptoms that should prompt her immediate return for reevaluation - such as, but not limited to the following: vision loss, new neurologic deficits, severe headache. She verbally expresses understanding of the information provided.

## 2013-02-15 NOTE — Assessment & Plan Note (Signed)
Pertinent Labs: Lab Results  Component Value Date   HGBA1C 15.0* 01/17/2013   CREATININE 0.60 02/10/2013   CHOL 141 01/18/2013   HDL 36* 01/18/2013   TRIG 309* 01/18/2013    Assessment: Disease Control: poor control (HgbA1C >9%)  Progress toward goals: improved  Barriers to meeting goals: financial need    Now is taking her Lantus regularly without missing doses (the Lantus sample that was given to her).  She is supposed to get her Medicaid within the next few days, therefore, will send refills at that point.  She is scheduled with Lupita Leash next week.  Plan: Glucometer log was reviewed today, as pt did have glucometer available for review. Preprandial to breakfast and dinner has CBG of 220-240 mg/dL. Indicating persistent basal hyperglycemia. Therefore, I will escalate her Lantus further.   Increase Lantus to 14 units qHS  We will attempt to increase metformin to 1000mg  in AM and 500mg . Will decrease back to 500mg  BID if she is not tolerating it 2/2 GI side effects.  Continue aspirin, ACE-I, statin.  reminded to bring blood glucose meter & log to each visit  Recommended to followup with Lupita Leash for diet education next week  Will need many diabetic preventative care measures - these are deferred for now until pt gets her Medicaid.  Home glucose monitoring recommendation: 2 times a day before breakfast;at bedtime

## 2013-02-15 NOTE — Patient Instructions (Signed)
General Instructions:  Please follow-up at the clinic in 3  weeks, at which time we will reevaluate your diabetes and blood pressure - OR, please follow-up in the clinic sooner if needed.  There have been changes in your medications:  INCREASE your Lantus to 14 units at bedtime  INCREASE your Metformin to 1000 mg (2 tablets) in the morning and 500mg  (1 tablet) in the evening.  Keep taking your aspirin daily   You have made so many positive steps forward - keep up all of your hard work.  Your goal blood sugar in the morning time is 70-130 mg/dL and 454 mg/dL 30 minutes after a meal  If you are diabetic, please bring your meter to your next visit.  Call us after you get your medicaid so we can make a referral for you for the cardiologist and eye doctor.  If you have severe headaches again or chest pain again --> go to the ER.  If symptoms worsen, or new symptoms arise, please call the clinic or go to the ER.  PLEASE BRING ALL OF YOUR MEDICATIONS  IN A BAG TO YOUR NEXT APPOINTMENT   Treatment Goals:  Goals (1 Years of Data) as of 02/15/13         As of Today 02/10/13 02/10/13 01/29/13 01/18/13     Blood Pressure    . Blood Pressure < 140/90  143/94 126/81 150/87 136/94 118/71     Result Component    . HEMOGLOBIN A1C < 7.0          . LDL CALC < 100      43      Progress Toward Treatment Goals:  Treatment Goal 01/29/2013  Hemoglobin A1C unable to assess    Self Care Goals & Plans:  Self Care Goal 01/29/2013  Manage my medications take my medicines as prescribed; refill my medications on time  Monitor my health keep track of my blood glucose; bring my glucose meter and log to each visit  Eat healthy foods eat more vegetables; eat foods that are low in salt  Be physically active park at the far end of the parking lot    Home Blood Glucose Monitoring 01/29/2013  Check my blood sugar 2 times a day  When to check my blood sugar before breakfast; at bedtime     Care  Management & Community Referrals:  Referral 01/29/2013  Referrals made for care management support diabetes educator

## 2013-02-15 NOTE — Assessment & Plan Note (Signed)
Assessment: Likely related to her uncontrolled DM2.   Plan:      Ophthalmology referral urgent after she gets her Medicaid.

## 2013-02-15 NOTE — Progress Notes (Signed)
Patient: April Smith   MRN: [id number]  DOB: 1966/09/12  PCP: Provider Default, MD   Subjective:    CC: Follow-up and Diabetes   HPI: Ms. Iridian Reader is a 47 y.o. female with a PMHx as outlined below, who presented to clinic today for the following:  1) DM2, uncontrolled -  Lab Results  Component Value Date   HGBA1C 15.0* 01/17/2013   Patient checking blood sugars 2 times daily, before breakfast and before dinner. Reports fasting blood sugars of  220-230s mg/dL. Currently taking Lantus 10 units qHS and Metformin 500 mg BID. States she initially was having some mild diarrhea with the metformin, but now is tolerating it better. Patient misses doses 0 x per week on average.  0 hypoglycemic episodes since last visit. Denies assisted hypoglycemia or recently hospitalizations for either hyper or hypoglycemia. admits to occasional diarrhea occasionally. Denies polyuria, polydipsia, nausea, vomiting.  does not request refills today.  2) Atypical Chest pain - pt was admitted from April 9-10 with atypical CP described as pins and needles in her left chest without radiation with EKG not concerning for acute MI, additionally, cardiac enzyme were cycled x 3 and found to be negative. During our last visit together, the patient indicated that since hospital discharge, she had 1 additional similar episode of pain that awoke her from her sleep. Still pins and needles sensation with brief associated SOB but without associated nausea/vomiting/ sweating. Lasted for 15 minutes and resolved spontaneously.   Then, on 5/3, she experienced similar issue, reported to the ER. At that time, this was thought to be due to anxiety, she was given Ativan with stabilization of her symptoms. EKG was unremarkable per report. Since 5/3, she has not had recurrence of CP symptoms. Continues to take the aspirin daily, no CP, SOB, N/V/abd pain.  3) Headache - Patient called the clinic on 5/1 indicating severe frontal and  occipital headache with blurred vision for which she was recommended to go to the ER. She went on 5/3 and this issue seemingly was not addressed. However, pt indicates resolution of her HA symptoms after treatment with Ativan as above for her CP. No fevers, chills, nuchal rigidity.   Review of Systems: Per HPI.   Current Outpatient Medications: Medication Sig  . aspirin EC 81 MG EC tablet Take 1 tablet (81 mg total) by mouth daily.  . carvedilol (COREG) 3.125 MG tablet Take 1 tablet (3.125 mg total) by mouth 2 (two) times daily with a meal. For your heart  . fluticasone (FLONASE) 50 MCG/ACT nasal spray Place 2 sprays into the nose daily as needed for rhinitis.  Marland Kitchen ibuprofen (ADVIL,MOTRIN) 200 MG tablet Take 400 mg by mouth every 6 (six) hours as needed for pain.   Marland Kitchen insulin glargine (LANTUS SOLOSTAR) 100 UNIT/ML injection Inject 0.1 mLs (10 Units total) into the skin at bedtime.  . Insulin Pen Needle (BD PEN NEEDLE NANO U/F) 32G X 4 MM MISC 1 each by Does not apply route once.  Marland Kitchen lisinopril (PRINIVIL,ZESTRIL) 2.5 MG tablet Take 1 tablet (2.5 mg total) by mouth daily. For kidney protection  . lovastatin (MEVACOR) 10 MG tablet Take 1 tablet (10 mg total) by mouth at bedtime. For cholesterol  . metFORMIN (GLUCOPHAGE) 500 MG tablet Take 1 tablet (500 mg total) by mouth 2 (two) times daily with a meal.    Allergies  Allergen Reactions  . Penicillins Swelling    Shortness of breath, rash    Past Medical History  Diagnosis  Date  . Diabetes mellitus type 2, uncontrolled 01/17/2013    Objective:    Physical Exam: Filed Vitals:   02/15/13 1649  BP: 136/89  Pulse: 94  Temp:      General Exam:   General: Vital signs reviewed and noted. Well-developed, well-nourished, in no acute distress; alert, appropriate and cooperative throughout examination.  Head: Normocephalic, atraumatic.  Eyes: No signs of anemia or jaundince.  Nose: Mucous membranes moist, not inflammed, nonerythematous.    Throat: Oropharynx nonerythematous, no exudate appreciated.   Neck: No deformities, masses, or tenderness noted. Supple, No carotid Bruits, no JVD.  Lungs:  Normal respiratory effort. Clear to auscultation BL without crackles or wheezes.  Heart: RRR. S1 and S2 normal without gallop, murmur, or rubs.  Abdomen:  BS normoactive. Soft, Nondistended, non-tender.  No masses or organomegaly.  Extremities: No pretibial edema.    Neurologic Exam:   Mental Status: Alert, oriented, thought content appropriate.  Speech fluent without evidence of aphasia. Able to follow 3 step commands without difficulty.  Cranial Nerves:   II: Visual fields grossly intact.  III/IV/VI: Extraocular movements intact.  Pupils reactive bilaterally.  V/VII: Smile symmetric. facial light touch sensation normal bilaterally.  VIII: Grossly intact.  IX/X: Normal gag.  XI: Bilateral shoulder shrug normal.  XII: Midline tongue extension normal.  Motor:  5/5 bilaterally with normal tone and bulk  Sensory:  Light touch intact throughout, bilaterally  Cerebellar: Normal finger-to-nose.   Assessment/ Plan:   The patient's case and plan of care was discussed with attending physician, Dr. Jonah Blue.

## 2013-02-15 NOTE — Assessment & Plan Note (Signed)
Pertinent Data: BP Readings from Last 3 Encounters:  02/15/13 136/89  02/10/13 126/81  01/29/13 136/94    Basic Metabolic Panel:    Component Value Date/Time   NA 137 02/10/2013 1941   K 4.1 02/10/2013 1941   CL 105 02/10/2013 1941   CO2 23 01/18/2013 0136   BUN 13 02/10/2013 1941   CREATININE 0.60 02/10/2013 1941   GLUCOSE 294* 02/10/2013 1941   CALCIUM 8.6 01/18/2013 0136    Assessment: Disease Control: controlled  Progress toward goals: at goal  Barriers to meeting goals: no barriers identified    Tolerating the Coreg and Lisinopril without issue.    Patient is compliant all of the time with prescribed medications.   Plan:  continue current medications  Educational resources provided:    Self management tools provided:

## 2013-02-16 NOTE — Progress Notes (Signed)
Case discussed with Dr. Kalia-Reynolds immediately after the resident saw the patient.  We reviewed the resident's history and exam and pertinent patient test results.  I agree with the assessment, diagnosis and plan of care documented in the resident's note. 

## 2013-02-23 ENCOUNTER — Other Ambulatory Visit: Payer: Self-pay | Admitting: *Deleted

## 2013-02-23 ENCOUNTER — Other Ambulatory Visit: Payer: Self-pay | Admitting: Internal Medicine

## 2013-02-23 DIAGNOSIS — E1165 Type 2 diabetes mellitus with hyperglycemia: Secondary | ICD-10-CM

## 2013-02-23 MED ORDER — METFORMIN HCL 500 MG PO TABS: ORAL_TABLET | ORAL | Status: DC

## 2013-02-27 ENCOUNTER — Other Ambulatory Visit: Payer: Self-pay | Admitting: *Deleted

## 2013-02-27 ENCOUNTER — Encounter: Payer: Self-pay | Admitting: Internal Medicine

## 2013-02-27 DIAGNOSIS — R079 Chest pain, unspecified: Secondary | ICD-10-CM

## 2013-02-27 DIAGNOSIS — E1165 Type 2 diabetes mellitus with hyperglycemia: Secondary | ICD-10-CM

## 2013-03-02 MED ORDER — METFORMIN HCL 500 MG PO TABS: ORAL_TABLET | ORAL | Status: DC

## 2013-03-02 MED ORDER — CARVEDILOL 3.125 MG PO TABS: 3.1250 mg | ORAL_TABLET | Freq: Two times a day (BID) | ORAL | Status: DC

## 2013-03-02 MED ORDER — LISINOPRIL 2.5 MG PO TABS: 2.5000 mg | ORAL_TABLET | Freq: Every day | ORAL | Status: DC

## 2013-03-02 MED ORDER — LOVASTATIN 10 MG PO TABS: 10.0000 mg | ORAL_TABLET | Freq: Every day | ORAL | Status: DC

## 2013-03-02 MED ORDER — INSULIN GLARGINE 100 UNIT/ML ~~LOC~~ SOLN: 14.0000 [IU] | Freq: Every day | SUBCUTANEOUS | Status: DC

## 2013-03-08 ENCOUNTER — Ambulatory Visit: Payer: Self-pay | Admitting: Internal Medicine

## 2013-03-16 ENCOUNTER — Encounter: Payer: Self-pay | Admitting: Internal Medicine

## 2013-03-16 ENCOUNTER — Ambulatory Visit (INDEPENDENT_AMBULATORY_CARE_PROVIDER_SITE_OTHER): Payer: Self-pay | Admitting: Internal Medicine

## 2013-03-16 VITALS — BP 132/82 | HR 102 | Temp 98.4°F | Ht 62.5 in | Wt 215.5 lb

## 2013-03-16 DIAGNOSIS — E1165 Type 2 diabetes mellitus with hyperglycemia: Secondary | ICD-10-CM

## 2013-03-16 LAB — GLUCOSE, CAPILLARY: Glucose-Capillary: 417 mg/dL — ABNORMAL HIGH (ref 70–99)

## 2013-03-16 MED ORDER — METFORMIN HCL 500 MG PO TABS: 500.0000 mg | ORAL_TABLET | Freq: Two times a day (BID) | ORAL | Status: DC

## 2013-03-16 NOTE — Progress Notes (Signed)
Patient ID: April Smith, female   DOB: [birth year], 47 y.o.   MRN: [id number]  Subjective:   Patient ID: April Smith female   DOB: [birth year] 47 y.o.   MRN: [id number]  HPI: [name] is a 47 y.o. female presenting for diabetes followup. At her last visit, she was noted to have persistent hyperglycemia on Lantus 10 units and metformin 500 twice a day. Therapy was escalated to Lantus 14 units at night and metformin 1000 in the morning and 500 in the evening. Patient says that she tolerated the increase in metformin poorly with frequent upset stomach in the morning and lasting through lunchtime. Because she felt so bad, she did not take her Lantus. She reports that she last took any Lantus on May 19. She's just been taking metformin 1000 the morning and 500 a night, with continued GI upset. She says she tolerated metformin 500 twice a day much better. She has brought in her meter today which shows persistently elevated preprandial morning as well as postprandial daytime glucoses ranging from 250-350. This morning she checked her blood sugar and it was 450. She says she continues to have some blurry vision. She says that she was informed by Medicaid of her acceptance and that her card would be mailed out to arrive at some point next week.   Past Medical History  Diagnosis Date  . Diabetes mellitus type 2, uncontrolled 01/17/2013   Current Outpatient Prescriptions  Medication Sig Dispense Refill  . aspirin EC 81 MG EC tablet Take 1 tablet (81 mg total) by mouth daily.      . carvedilol (COREG) 3.125 MG tablet Take 1 tablet (3.125 mg total) by mouth 2 (two) times daily with a meal. For your heart  180 tablet  1  . fluticasone (FLONASE) 50 MCG/ACT nasal spray Place 2 sprays into the nose daily as needed for rhinitis.      Marland Kitchen ibuprofen (ADVIL,MOTRIN) 200 MG tablet Take 400 mg by mouth every 6 (six) hours as needed for pain.       Marland Kitchen insulin glargine (LANTUS) 100 UNIT/ML injection Inject 0.14 mLs  (14 Units total) into the skin at bedtime.  30 mL  3  . Insulin Pen Needle (BD PEN NEEDLE NANO U/F) 32G X 4 MM MISC 1 each by Does not apply route once.  50 each  4  . lisinopril (PRINIVIL,ZESTRIL) 2.5 MG tablet Take 1 tablet (2.5 mg total) by mouth daily. For kidney protection  90 tablet  1  . lovastatin (MEVACOR) 10 MG tablet Take 1 tablet (10 mg total) by mouth at bedtime. For cholesterol  90 tablet  1  . metFORMIN (GLUCOPHAGE) 500 MG tablet 2 tablets in the morning and 1 tablet in the evening.  270 tablet  0  . metFORMIN (GLUCOPHAGE) 500 MG tablet 2 tablets in the morning and 1 tablet in the evening.  90 tablet  3   No current facility-administered medications for this visit.   Family History  Problem Relation Age of Onset  . Kawasaki disease Son    History   Social History  . Marital Status: Single    Spouse Name: N/A    Number of Children: 4  . Years of Education: 12th grade   Occupational History  . House cleaning    Social History Main Topics  . Smoking status: Never Smoker   . Smokeless tobacco: None  . Alcohol Use: No  . Drug Use: No  . Sexually Active: None   Other  Topics Concern  . None   Social History Narrative   Lives in [place], [place] with her 4 children   Review of Systems: 10 pt ROS performed, pertinent positives and negatives noted in HPI Objective:  Physical Exam: Filed Vitals:   03/16/13 0957  BP: 132/82  Pulse: 102  Temp: 98.4 F (36.9 C)  TempSrc: Oral  Height: 5' 2.5" (1.588 m)  Weight: 215 lb 8 oz (97.75 kg)  SpO2: 98%   Vitals reviewed. General: sitting in chair, NAD HEENT: PERRL, EOMI, no scleral icterus Cardiac: RRR, no rubs, murmurs or gallops Pulm: clear to auscultation bilaterally, no wheezes, rales, or rhonchi Abd: soft, nontender, nondistended, BS present Ext: warm and well perfused, no pedal edema Neuro: alert and oriented X3, cranial nerves II-XII grossly intact, strength and sensation to light touch equal in bilateral  upper and lower extremities  Assessment & Plan:   Please see problem-based charting for assessment and plan.

## 2013-03-16 NOTE — Patient Instructions (Signed)
1. Take your Metformin 1 pill twice a day. 2. Take your lantus 14 units EVERY NIGHT. Call when you run out [phone]. 3. Come back to the clinic in 2-3 weeks.

## 2013-03-16 NOTE — Assessment & Plan Note (Addendum)
Lab Results  Component Value Date   HGBA1C 15.0* 01/17/2013   HGBA1C  Value: 7.7 (NOTE)                                                                       According to the ADA Clinical Practice Recommendations for 2011, when HbA1c is used as a screening test:   >=6.5%   Diagnostic of Diabetes Mellitus           (if abnormal result  is confirmed)  5.7-6.4%   Increased risk of developing Diabetes Mellitus  References:Diagnosis and Classification of Diabetes Mellitus,Diabetes Care,2011,34(Suppl 1):S62-S69 and Standards of Medical Care in         Diabetes - 2011,Diabetes Care,2011,34  (Suppl 1):S11-S61.* 10/14/2010     Assessment: Diabetes control: poor control (HgbA1C >9%) Progress toward A1C goal:  deteriorated   Plan: Medications:  Decrease metformin to 500 BID due to inability to tolerate SE at higher dose. Emphasized need to comply with lantus 14U qhs. Provided pen today.  Home glucose monitoring: Frequency: 4 times a day Timing: before breakfast;before meals;after dinner Instruction/counseling given: reminded to bring blood glucose meter & log to each visit Educational resources provided: brochure Self management tools provided:     Told patient that the insulin pen would run out in 8 days. She expects to receive her Medicaid card in the mail next week. Told her if she runs out of Lantus before receiving Medicaid to call us. Will need to prescribe an alternate, less expensive long-acting insulin. Told her she did not receive Medicaid, will need to immediately apply for the orange card and she needs many diabetic preventive care measures. She agrees. We'll have her followup here in the clinic in 2-3 weeks.  ADDENDUM 03/20/2013 4:49 PM  Patient calls saying she is still feeling nauseous even with taking the decreased dose of metformin. Nighttime blood sugars around 250 and am blood sugars 220 on 14 units of Lantus nightly.  - Told patient to stop taking regular metformin. I can prescribe  extended release for her which she can get for free appears to your pharmacy. Told her to try taking this as it will help some people with the side effects of GI upset. If she still is nauseous and upset stomach on it, stop taking metformin and we will consider addition of sulfonylurea or other agent at her clinic appointment in about 10 days. - Patient to increase Lantus to 18 units at night

## 2013-03-20 ENCOUNTER — Encounter: Payer: Self-pay | Admitting: Dietician

## 2013-03-20 ENCOUNTER — Telehealth: Payer: Self-pay | Admitting: *Deleted

## 2013-03-20 MED ORDER — INSULIN GLARGINE 100 UNIT/ML ~~LOC~~ SOLN: 18.0000 [IU] | Freq: Every day | SUBCUTANEOUS | Status: DC

## 2013-03-20 MED ORDER — METFORMIN HCL ER (MOD) 500 MG PO TB24: 1000.0000 mg | ORAL_TABLET | Freq: Every day | ORAL | Status: DC

## 2013-03-20 NOTE — Telephone Encounter (Signed)
Called pt, changes made and addendum to last clinic note w new Rx. Thank you cz

## 2013-03-20 NOTE — Addendum Note (Signed)
Addended by: Ignacia Palma on: 03/20/2013 04:49 PM   Modules accepted: Orders, Medications

## 2013-03-20 NOTE — Telephone Encounter (Signed)
Pt states she has been taking metformin since Friday, when she takes it she becomes nauseous and then vomits, this is becoming very frustrating could you please call her. [phone]

## 2013-03-21 NOTE — Progress Notes (Signed)
TEACHING ATTENDING ADDENDUM: I discussed this case with Dr. Zeimer at the time of the patient visit. I agree with the HPI, exam findings and have read the documentation provided by the resident,  and I concur with the plan of care. Please see the resident note for details of management.  

## 2013-03-27 ENCOUNTER — Other Ambulatory Visit: Payer: Self-pay | Admitting: Internal Medicine

## 2013-04-05 ENCOUNTER — Encounter: Payer: Self-pay | Admitting: Internal Medicine

## 2013-04-19 ENCOUNTER — Other Ambulatory Visit: Payer: Self-pay

## 2013-05-16 ENCOUNTER — Other Ambulatory Visit: Payer: Self-pay

## 2013-06-07 ENCOUNTER — Encounter: Payer: Self-pay | Admitting: Internal Medicine

## 2013-08-16 ENCOUNTER — Other Ambulatory Visit: Payer: Self-pay

## 2013-10-08 ENCOUNTER — Ambulatory Visit: Payer: Self-pay | Admitting: Family Medicine

## 2013-10-08 VITALS — BP 115/80 | HR 118 | Temp 98.9°F | Resp 18 | Ht 64.0 in | Wt 208.0 lb

## 2013-10-08 DIAGNOSIS — F329 Major depressive disorder, single episode, unspecified: Secondary | ICD-10-CM

## 2013-10-08 DIAGNOSIS — Z8639 Personal history of other endocrine, nutritional and metabolic disease: Secondary | ICD-10-CM

## 2013-10-08 DIAGNOSIS — IMO0001 Reserved for inherently not codable concepts without codable children: Secondary | ICD-10-CM

## 2013-10-08 DIAGNOSIS — T2220XA Burn of second degree of shoulder and upper limb, except wrist and hand, unspecified site, initial encounter: Secondary | ICD-10-CM

## 2013-10-08 DIAGNOSIS — F411 Generalized anxiety disorder: Secondary | ICD-10-CM

## 2013-10-08 DIAGNOSIS — Z862 Personal history of diseases of the blood and blood-forming organs and certain disorders involving the immune mechanism: Secondary | ICD-10-CM

## 2013-10-08 DIAGNOSIS — R42 Dizziness and giddiness: Secondary | ICD-10-CM

## 2013-10-08 DIAGNOSIS — F32A Depression, unspecified: Secondary | ICD-10-CM

## 2013-10-08 LAB — GLUCOSE, POCT (MANUAL RESULT ENTRY)

## 2013-10-08 MED ORDER — INSULIN ASPART 100 UNIT/ML ~~LOC~~ SOLN
10.0000 [IU] | Freq: Once | SUBCUTANEOUS | Status: AC
  Administered 2013-10-08: 10 [IU] via SUBCUTANEOUS

## 2013-10-08 MED ORDER — LORAZEPAM 1 MG PO TABS: 1.0000 mg | ORAL_TABLET | Freq: Two times a day (BID) | ORAL | Status: AC | PRN

## 2013-10-08 MED ORDER — GLIPIZIDE 5 MG PO TABS: 10.0000 mg | ORAL_TABLET | Freq: Two times a day (BID) | ORAL | Status: AC

## 2013-10-08 MED ORDER — INSULIN ASPART 100 UNIT/ML ~~LOC~~ SOLN: 10.0000 [IU] | Freq: Once | SUBCUTANEOUS | Status: DC

## 2013-10-08 MED ORDER — METFORMIN HCL 500 MG PO TABS: 500.0000 mg | ORAL_TABLET | Freq: Two times a day (BID) | ORAL | Status: AC
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
## 2013-10-08 NOTE — Patient Instructions (Signed)
Take lorazepam one twice daily for anxiety  Begin metformin one twice daily for diabetes  Begin glipizide one twice daily for diabetes  If you develop suicidal thoughts go to the emergency room or call somebody for help immediately or come in here.  Someone must spend the night with you tonight. He figured order or your friend Jasmine December can do so.  Return tomorrow for a recheck

## 2013-10-09 ENCOUNTER — Telehealth: Payer: Self-pay

## 2013-10-09 ENCOUNTER — Encounter (HOSPITAL_COMMUNITY): Payer: Self-pay | Admitting: Emergency Medicine

## 2013-10-09 ENCOUNTER — Ambulatory Visit (INDEPENDENT_AMBULATORY_CARE_PROVIDER_SITE_OTHER): Payer: Self-pay | Admitting: Family Medicine

## 2013-10-09 ENCOUNTER — Emergency Department (HOSPITAL_COMMUNITY)
Admission: EM | Admit: 2013-10-09 | Discharge: 2013-10-10 | Disposition: A | Discharge status: Home / Self Care | Payer: Medicaid Other | Attending: Emergency Medicine | Admitting: Emergency Medicine

## 2013-10-09 VITALS — BP 130/80 | HR 100 | Temp 98.0°F | Resp 16 | Ht 62.0 in | Wt 208.0 lb

## 2013-10-09 DIAGNOSIS — Z880 Allergy status to penicillin: Secondary | ICD-10-CM | POA: Insufficient documentation

## 2013-10-09 DIAGNOSIS — R1110 Vomiting, unspecified: Secondary | ICD-10-CM | POA: Insufficient documentation

## 2013-10-09 DIAGNOSIS — R1084 Generalized abdominal pain: Secondary | ICD-10-CM | POA: Insufficient documentation

## 2013-10-09 DIAGNOSIS — R110 Nausea: Secondary | ICD-10-CM

## 2013-10-09 DIAGNOSIS — Z79899 Other long term (current) drug therapy: Secondary | ICD-10-CM | POA: Insufficient documentation

## 2013-10-09 DIAGNOSIS — F329 Major depressive disorder, single episode, unspecified: Secondary | ICD-10-CM

## 2013-10-09 DIAGNOSIS — R51 Headache: Secondary | ICD-10-CM | POA: Insufficient documentation

## 2013-10-09 DIAGNOSIS — F32A Depression, unspecified: Secondary | ICD-10-CM | POA: Diagnosis present

## 2013-10-09 DIAGNOSIS — F430 Acute stress reaction: Secondary | ICD-10-CM | POA: Insufficient documentation

## 2013-10-09 DIAGNOSIS — IMO0001 Reserved for inherently not codable concepts without codable children: Secondary | ICD-10-CM

## 2013-10-09 DIAGNOSIS — R630 Anorexia: Secondary | ICD-10-CM | POA: Insufficient documentation

## 2013-10-09 DIAGNOSIS — F322 Major depressive disorder, single episode, severe without psychotic features: Secondary | ICD-10-CM

## 2013-10-09 LAB — CBC WITH DIFFERENTIAL/PLATELET
Basophils Relative: 0 % (ref 0–1)
Lymphocytes Relative: 24 % (ref 12–46)
Lymphs Abs: 2.1 10*3/uL (ref 0.7–4.0)
MCHC: 33.1 g/dL (ref 30.0–36.0)
Monocytes Absolute: 0.5 10*3/uL (ref 0.1–1.0)
Neutro Abs: 6.3 10*3/uL (ref 1.7–7.7)
Neutrophils Relative %: 71 % (ref 43–77)
Platelets: 313 10*3/uL (ref 150–400)
RBC: 5.17 MIL/uL — ABNORMAL HIGH (ref 3.87–5.11)
RDW: 13.1 % (ref 11.5–15.5)
WBC: 8.9 10*3/uL (ref 4.0–10.5)

## 2013-10-09 LAB — COMPREHENSIVE METABOLIC PANEL
ALT: 13 U/L (ref 0–35)
Alkaline Phosphatase: 55 U/L (ref 39–117)
Alkaline Phosphatase: 60 U/L (ref 39–117)
BUN: 13 mg/dL (ref 6–23)
CO2: 26 mEq/L (ref 19–32)
CO2: 24 mEq/L (ref 19–32)
Creat: 0.63 mg/dL (ref 0.50–1.10)
GFR calc Af Amer: >90 mL/min (ref >90)
GFR calc non Af Amer: >90 mL/min (ref >90)
Glucose, Bld: 288 mg/dL — ABNORMAL HIGH (ref 70–99)
Glucose, Bld: 298 mg/dL — ABNORMAL HIGH (ref 70–99)
Potassium: 4 mEq/L (ref 3.7–5.3)
Sodium: 136 mEq/L (ref 135–145)
Sodium: 134 mEq/L — ABNORMAL LOW (ref 137–147)
Total Bilirubin: 0.6 mg/dL (ref 0.3–1.2)
Total Protein: 7.1 g/dL (ref 6.0–8.3)

## 2013-10-09 LAB — RAPID URINE DRUG SCREEN, HOSP PERFORMED
Amphetamines: NOT DETECTED
Benzodiazepines: NOT DETECTED
Tetrahydrocannabinol: NOT DETECTED

## 2013-10-09 LAB — GLUCOSE, CAPILLARY: Glucose-Capillary: 232 mg/dL — ABNORMAL HIGH (ref 70–99)

## 2013-10-09 MED ORDER — ONDANSETRON 4 MG PO TBDP
4.0000 mg | ORAL_TABLET | Freq: Once | ORAL | Status: AC
  Administered 2013-10-09: 4 mg via ORAL

## 2013-10-09 MED ORDER — ONDANSETRON HCL 4 MG PO TABS: 4.0000 mg | ORAL_TABLET | Freq: Three times a day (TID) | ORAL | Status: DC | PRN

## 2013-10-09 MED ORDER — NICOTINE 21 MG/24HR TD PT24: 21.0000 mg | MEDICATED_PATCH | Freq: Every day | TRANSDERMAL | Status: DC

## 2013-10-09 MED ORDER — METFORMIN HCL 500 MG PO TABS
500.0000 mg | ORAL_TABLET | Freq: Two times a day (BID) | ORAL | Status: DC
  Administered 2013-10-09 – 2013-10-10 (×2): 500 mg via ORAL
  Filled 2013-10-09 (×4): qty 1

## 2013-10-09 MED ORDER — GLIPIZIDE 10 MG PO TABS
10.0000 mg | ORAL_TABLET | Freq: Two times a day (BID) | ORAL | Status: DC
  Administered 2013-10-09 – 2013-10-10 (×2): 10 mg via ORAL
  Filled 2013-10-09 (×4): qty 1

## 2013-10-09 MED ORDER — CITALOPRAM HYDROBROMIDE 20 MG PO TABS: 20.0000 mg | ORAL_TABLET | Freq: Every day | ORAL | Status: AC

## 2013-10-09 MED ORDER — ONDANSETRON 4 MG PO TBDP: 4.0000 mg | ORAL_TABLET | Freq: Three times a day (TID) | ORAL | Status: DC | PRN

## 2013-10-09 MED ORDER — ALUM & MAG HYDROXIDE-SIMETH 200-200-20 MG/5ML PO SUSP: 30.0000 mL | ORAL | Status: DC | PRN

## 2013-10-09 MED ORDER — SILVER SULFADIAZINE 1 % EX CREA
TOPICAL_CREAM | Freq: Two times a day (BID) | CUTANEOUS | Status: DC
  Administered 2013-10-09: 19:00:00 via TOPICAL
  Filled 2013-10-09: qty 50

## 2013-10-09 MED ORDER — ONDANSETRON 4 MG PO TBDP: 4.0000 mg | ORAL_TABLET | Freq: Three times a day (TID) | ORAL | Status: AC | PRN

## 2013-10-09 MED ORDER — LORAZEPAM 1 MG PO TABS: 1.0000 mg | ORAL_TABLET | Freq: Three times a day (TID) | ORAL | Status: DC | PRN

## 2013-10-09 MED ORDER — IBUPROFEN 200 MG PO TABS: 600.0000 mg | ORAL_TABLET | Freq: Three times a day (TID) | ORAL | Status: DC | PRN

## 2013-10-09 MED ORDER — ZOLPIDEM TARTRATE 5 MG PO TABS: 5.0000 mg | ORAL_TABLET | Freq: Every evening | ORAL | Status: DC | PRN

## 2013-10-09 MED ORDER — CITALOPRAM HYDROBROMIDE 20 MG PO TABS
20.0000 mg | ORAL_TABLET | Freq: Every day | ORAL | Status: DC
  Administered 2013-10-10: 20 mg via ORAL
  Filled 2013-10-09: qty 1

## 2013-10-09 MED ORDER — ACETAMINOPHEN 325 MG PO TABS: 650.0000 mg | ORAL_TABLET | ORAL | Status: DC | PRN

## 2013-10-09 NOTE — ED Notes (Signed)
If patient needs to call for a ride or to tell someone where she is.Marland KitchenMarland KitchenBerenice Bouton [phone]

## 2013-10-09 NOTE — ED Notes (Signed)
Pt. Family are going to take belongings home

## 2013-10-09 NOTE — ED Provider Notes (Signed)
  Medical screening examination/treatment/procedure(s) were performed by non-physician practitioner and as supervising physician I was immediately available for consultation/collaboration.  EKG Interpretation   None          Caley Volkert, MD 10/09/13 2126 

## 2013-10-09 NOTE — Progress Notes (Signed)
Subjective: Patient was in here last night severely depressed. Apparently she still did not sleep hardly any last night. She is continued to cry a lot. Percent doesn't know what he can do with her. Her friend Jasmine December for her back in here today. She feels the need to work tomorrow for income, but then is off for a while. I told her I don't think she is really for working yet tomorrow. She is much more talkative than she was. Her friend Jasmine December accompanies her here again today.  Objective: Crying. It's more talkative. Still has no idea of why her husband often left. Denies any suicidal ideation.  Assessment: Acute severe depression Uncontrolled diabetes mellitus  Plan: Check glucose and C. Met  Results for orders placed in visit on 10/09/13  GLUCOSE, POCT (MANUAL RESULT ENTRY)      Result Value Range   POC Glucose 296 (*) 70 - 99 mg/dl   Patient promised me she would not do anything to herself without seeking help. I think his sugar will come on in to control on the medication for several days.  Addendum: Patient got nauseated and vomited once in the office. She was given some Zofran. I had hoped to be able to stabilize her enough to trigger outpatient, but it is obvious that she is needing to go into a behavior health setting. She has just become almost catatonic again before leaving the office. She just stares blankly. I am going to have her transported to [facility] for a psychiatric evaluation.

## 2013-10-09 NOTE — ED Notes (Signed)
Pt coming with EMS from [facility] urgent care with c/o severe depression. Pt was seen yesterday for same and Per EMS was in catatonic state for about an hour. Per EMS pt is talking about cooking for husband and then changes subjects or will get really quiet and not talk at all.

## 2013-10-09 NOTE — ED Provider Notes (Signed)
CSN: [id number]     Arrival date & time 10/09/13  1759 History  This chart was scribed for non-physician practitioner, Trixie Dredge, PA-C,working with Gerhard Munch, MD, by Karle Plumber, ED Scribe.  This patient was seen in room [facility] and the patient's care was started at 6:28 PM.  Chief Complaint  Patient presents with  . Medical Clearance   The history is provided by the patient and a friend. No language interpreter was used.   HPI Comments:  April Smith is a 47 y.o. female brought in by ambulance, who presents to the Emergency Department complaining of depression. Pt states "I'm fine, I'm fine". Pt states she wants the pain to stop. She gives her friend permission to tell her story. Pt was recently married this past February and all was fine. She states pt's husband packed up and left a couple of days ago. She states he stood up and stated he wanted more and packed his things and left with no explanation. Pt is tearful during exam while her friend gives the history. Friend states that the pt sent her a text yesterday to tell her son that she loved him and goodbye. Pt does not recall saying goodbye to him. Pt states she recently burned her left arm, but cannot recall how. Her friend states she was told by pt's children pt burned herself while cooking. Pt states she has no recollection of the incident. Friend states she took her to UC to have the burn checked and the doctors were concerned with her well-being and mental state while there. Friend states pt blames herself for her husband leaving. Friend states pt seems spaced out a lot of the time. Pt's friend reports pt was recently diagnosed with DM, but pt has no recollection of that either. Friend states pt had two episodes of emesis today. Pt reports a HA for the past three days. She denies CP, SOB, diarrhea, or abdominal pain.  Past Medical History  Diagnosis Date  . Diabetes mellitus type 2, uncontrolled 01/17/2013  . Hyperlipidemia     Past Surgical History  Procedure Laterality Date  . No past surgeries     Family History  Problem Relation Age of Onset  . Kawasaki disease Son    History  Substance Use Topics  . Smoking status: Never Smoker   . Smokeless tobacco: Not on file  . Alcohol Use: No   OB History   Grav Para Term Preterm Abortions TAB SAB Ect Mult Living                 Review of Systems  Constitutional: Positive for appetite change.  Respiratory: Negative for shortness of breath.   Cardiovascular: Negative for chest pain.  Gastrointestinal: Positive for vomiting. Negative for abdominal pain and diarrhea.  Skin: Positive for wound (burn to left arm).  Neurological: Positive for headaches.    Allergies  Penicillins  Home Medications    Triage Vitals: BP 134/87  Pulse 104  Temp(Src) 98.1 F (36.7 C)  Resp 16  SpO2 100%  LMP 09/30/2013 Physical Exam  Nursing note and vitals reviewed. Constitutional: She appears well-developed and well-nourished. No distress.  HENT:  Head: Normocephalic and atraumatic.  Neck: Neck supple.  Cardiovascular: Normal rate and regular rhythm.   Pulmonary/Chest: Effort normal and breath sounds normal. No respiratory distress. She has no wheezes. She has no rales.  Abdominal: Soft. She exhibits no distension. There is tenderness (generalized tenderness). There is no rebound and no guarding.  Neurological: She  is alert.  Skin: She is not diaphoretic.       ED Course  Procedures (including critical care time) DIAGNOSTIC STUDIES: Oxygen Saturation is 100% on RA, normal by my interpretation.   COORDINATION OF CARE: 6:40 PM- Will obtain blood work and urine for standard medical clearance labs. Will redress burn. Pt verbalizes understanding and agrees to plan.  Medications - No data to display  Labs Review Labs Reviewed  CBC WITH DIFFERENTIAL - Abnormal; Notable for the following:    RBC 5.17 (*)    All other components within normal limits  URINE  RAPID DRUG SCREEN (HOSP PERFORMED)  COMPREHENSIVE METABOLIC PANEL  ACETAMINOPHEN LEVEL  SALICYLATE LEVEL   Imaging Review No results found.  EKG Interpretation   None       MDM   1. Acute stress reaction   2. Hyperglycemia.   Pt with acute stress reaction following husband suddenly leaving without any warning.  Has been in fugue-type state since then and has injured herself without knowing how, sent a concerning text message around that time asking her friend to tell her son goodbye.  Pt is tearful and does occasionally stare off and not respond.    Pt is medically cleared, significant for hyperglycemia.  Psychiatry evaluation pending.       I personally performed the services described in this documentation, which was scribed in my presence. The recorded information has been reviewed and is accurate.    Trixie Dredge, PA-C 10/09/13 2015

## 2013-10-09 NOTE — Patient Instructions (Signed)
Take the lorazepam 1 mg every morning after breakfast and 1 approximately one half hour before going to sleep, about when you start heading toward bed.  Take the 2 diabetic pills about half way through breakfast and supper each day  Use the Zofran nausea pills only if needed for nausea and vomiting. They do not have to be swollen it appears that your stomach. You can just dissolve it under your tongue and it will have fairly fast action in 15-30 minutes  Take the citalopram one each day after breakfast beginning Thursday or Friday morning  Return Saturday to see me  See your pastor for counseling or contact Karmen Bongo at   If at any point you were thinking of harming himself in any fashion or committing suicide you have promised that you will come here, go to the emergency room, call 911, or get some other immediate help.  Come in sooner if any time if you're having problems. Even if I am not hear someone will take good care of you.

## 2013-10-10 ENCOUNTER — Encounter (HOSPITAL_COMMUNITY): Payer: Self-pay | Admitting: Registered Nurse

## 2013-10-10 DIAGNOSIS — F329 Major depressive disorder, single episode, unspecified: Secondary | ICD-10-CM

## 2013-10-10 DIAGNOSIS — F430 Acute stress reaction: Secondary | ICD-10-CM

## 2013-10-10 DIAGNOSIS — F32A Depression, unspecified: Secondary | ICD-10-CM | POA: Diagnosis present

## 2013-10-10 NOTE — ED Provider Notes (Signed)
Patient seen and evaluated by Dr. Ladona Ridgel. Medical cleared for discharge and outpatient resources.  Rolland Porter, MD 10/10/13 1040

## 2013-10-10 NOTE — Progress Notes (Addendum)
Patient signed a no harm contract.  Writer consulted with the Psychiatrist (Dr. Ladona Ridgel) and the NP [facility]) regarding the patient being psychiatrically stable for discharge with resources.  Writer provided outpatient resources for the patient.   Writer informed the ER MD (Dr. Fayrene Fearing) and the nurse Trula Ore) that the patient will be discharged.

## 2013-10-10 NOTE — Consult Note (Signed)
Note reviewed and agreed with  

## 2013-10-10 NOTE — Consult Note (Signed)
[facility] Face-to-Face Psychiatry Consult   Reason for Consult:  Depression Referring Physician:  EDP  April Smith is an 47 y.o. female.  Assessment: AXIS I:  Depressive Disorder NOS AXIS II:  Deferred AXIS III:   Past Medical History  Diagnosis Date  . Diabetes mellitus type 2, uncontrolled 01/17/2013  . Hyperlipidemia    AXIS IV:  other psychosocial or environmental problems and problems related to social environment AXIS V:  51-60 moderate symptoms  Plan:  No evidence of imminent risk to self or others at present.   Patient does not meet criteria for psychiatric inpatient admission. Supportive therapy provided about ongoing stressors. Discussed crisis plan, support from social network, calling 911, coming to the Emergency Department, and calling Suicide Hotline.  Subjective:   April Smith is a 47 y.o. female.  HPI:  Patient states that she went to Urgent care related to burn on her arm. "At urgent care the doctor didn't think I looked rights, where I had been crying.  My husband of six months just packed up everything and left; You can't tell that he has ever been there.   All he said was he needed more.  My son said I was walking around like a zombie  With no feelings and that's how I burned my self taking something out of the oven and the grease splashed on my arm.  I don't want to kill my self; I'm depresses cause of everything that has happened but I don't want to kill myself."  Patient denies suicidal/homicidal ideation, psychosis, and paranoia.  Patient lives with her son and has 3 other children all grown.  Patient states that she does not have and psych history hospitalization/medication/outpatient.  Patient states that she feels safe to go home but is interested ing counseling.    Past Psychiatric History: Past Medical History  Diagnosis Date  . Diabetes mellitus type 2, uncontrolled 01/17/2013  . Hyperlipidemia     reports that she has never smoked. She does not have any  smokeless tobacco history on file. She reports that she does not drink alcohol or use illicit drugs. Family History  Problem Relation Age of Onset  . Kawasaki disease Son    Family History Substance Abuse: No Family Supports: Yes, List: (Children) Living Arrangements: Children (13 yo son) Can pt return to current living arrangement?: Yes Abuse/Neglect [employer] [facility] [employer] [facility]) Physical Abuse: Denies Verbal Abuse: Denies Sexual Abuse: Denies Allergies:   Allergies  Allergen Reactions  . Penicillins Swelling    Shortness of breath, rash    ACT Assessment Complete:  Yes:    Educational Status    Risk to Self: Risk to self Suicidal Ideation: No Suicidal Intent: No Is patient at risk for suicide?: No Suicidal Plan?: No Access to Means: No What has been your use of drugs/alcohol within the last 12 months?: Denies Previous Attempts/Gestures: No How many times?:  (None) Other Self Harm Risks: None identified Triggers for Past Attempts: None known Intentional Self Injurious Behavior: None Family Suicide History: No Recent stressful life event(s): Loss (Comment) (Husband left 2 days ago) Persecutory voices/beliefs?: No Depression: Yes Depression Symptoms: Despondent;Insomnia;Tearfulness;Isolating;Guilt;Loss of interest in usual pleasures;Feeling worthless/self pity;Fatigue Substance abuse history and/or treatment for substance abuse?: No Suicide prevention information given to non-admitted patients: Yes  Risk to Others: Risk to Others Homicidal Ideation: No Thoughts of Harm to Others: No Current Homicidal Intent: No Current Homicidal Plan: No Access to Homicidal Means: No Identified Victim: Na History of harm to others?: No Assessment of Violence:  None Noted Violent Behavior Description: Na Does patient have access to weapons?: No Criminal Charges Pending?: No Does patient have a court date: No  Abuse: Abuse/Neglect Assessment (Assessment to be complete while patient is alone) Physical  Abuse: Denies Verbal Abuse: Denies Sexual Abuse: Denies Exploitation of patient/patient's resources: Denies Self-Neglect: Denies  Prior Inpatient Therapy: Prior Inpatient Therapy Prior Inpatient Therapy: No Prior Therapy Dates: Na Prior Therapy Facilty/Provider(s): Na Reason for Treatment: Na  Prior Outpatient Therapy: Prior Outpatient Therapy Prior Outpatient Therapy: No Prior Therapy Dates: Na Prior Therapy Facilty/Provider(s): Na Reason for Treatment: Na  Additional Information: Additional Information 1:1 In Past 12 Months?: No CIRT Risk: No Elopement Risk: No Does patient have medical clearance?: Yes                  Objective: Blood pressure 135/83, pulse 92, temperature 98 F (36.7 C), temperature source Oral, resp. rate 20, last menstrual period 09/30/2013, SpO2 100.00%.There is no weight on file to calculate BMI. Results for orders placed during the hospital encounter of 10/09/13 (from the past 72 hour(s))  CBC WITH DIFFERENTIAL     Status: Abnormal   Collection Time    10/09/13  6:50 PM      Result Value Range   WBC 8.9  4.0 - 10.5 K/uL   RBC 5.17 (*) 3.87 - 5.11 MIL/uL   Hemoglobin 13.8  12.0 - 15.0 g/dL   HCT 16.1  09.6 - 04.5 %   MCV 80.7  78.0 - 100.0 fL   MCH 26.7  26.0 - 34.0 pg   MCHC 33.1  30.0 - 36.0 g/dL   RDW 40.9  81.1 - 91.4 %   Platelets 313  150 - 400 K/uL   Neutrophils Relative % 71  43 - 77 %   Neutro Abs 6.3  1.7 - 7.7 K/uL   Lymphocytes Relative 24  12 - 46 %   Lymphs Abs 2.1  0.7 - 4.0 K/uL   Monocytes Relative 5  3 - 12 %   Monocytes Absolute 0.5  0.1 - 1.0 K/uL   Eosinophils Relative 0  0 - 5 %   Eosinophils Absolute 0.0  0.0 - 0.7 K/uL   Basophils Relative 0  0 - 1 %   Basophils Absolute 0.0  0.0 - 0.1 K/uL  COMPREHENSIVE METABOLIC PANEL     Status: Abnormal   Collection Time    10/09/13  6:50 PM      Result Value Range   Sodium 134 (*) 137 - 147 mEq/L   Comment: Please note change in reference range.   Potassium 4.0   3.7 - 5.3 mEq/L   Comment: Please note change in reference range.   Chloride 97  96 - 112 mEq/L   CO2 24  19 - 32 mEq/L   Glucose, Bld 298 (*) 70 - 99 mg/dL   BUN 13  6 - 23 mg/dL   Creatinine, Ser 7.82  0.50 - 1.10 mg/dL   Calcium 9.8  8.4 - 95.6 mg/dL   Total Protein 7.8  6.0 - 8.3 g/dL   Albumin 3.6  3.5 - 5.2 g/dL   AST 11  0 - 37 U/L   ALT 13  0 - 35 U/L   Alkaline Phosphatase 60  39 - 117 U/L   Total Bilirubin 0.4  0.3 - 1.2 mg/dL   GFR calc non Af Amer >90  >90 mL/min   GFR calc Af Amer >90  >90 mL/min   Comment: (NOTE)  The eGFR has been calculated using the CKD EPI equation.     This calculation has not been validated in all clinical situations.     eGFR's persistently <90 mL/min signify possible Chronic Kidney     Disease.  ACETAMINOPHEN LEVEL     Status: None   Collection Time    10/09/13  6:50 PM      Result Value Range   Acetaminophen (Tylenol), Serum <15.0  10 - 30 ug/mL   Comment:            THERAPEUTIC CONCENTRATIONS VARY     SIGNIFICANTLY. A RANGE OF 10-30     ug/mL MAY BE AN EFFECTIVE     CONCENTRATION FOR MANY PATIENTS.     HOWEVER, SOME ARE BEST TREATED     AT CONCENTRATIONS OUTSIDE THIS     RANGE.     ACETAMINOPHEN CONCENTRATIONS     >150 ug/mL AT 4 HOURS AFTER     INGESTION AND >50 ug/mL AT 12     HOURS AFTER INGESTION ARE     OFTEN ASSOCIATED WITH TOXIC     REACTIONS.  SALICYLATE LEVEL     Status: Abnormal   Collection Time    10/09/13  6:50 PM      Result Value Range   Salicylate Lvl <2.0 (*) 2.8 - 20.0 mg/dL  URINE RAPID DRUG SCREEN (HOSP PERFORMED)     Status: None   Collection Time    10/09/13  7:04 PM      Result Value Range   Opiates NONE DETECTED  NONE DETECTED   Cocaine NONE DETECTED  NONE DETECTED   Benzodiazepines NONE DETECTED  NONE DETECTED   Amphetamines NONE DETECTED  NONE DETECTED   Tetrahydrocannabinol NONE DETECTED  NONE DETECTED   Barbiturates NONE DETECTED  NONE DETECTED   Comment:            DRUG SCREEN FOR MEDICAL  PURPOSES     ONLY.  IF CONFIRMATION IS NEEDED     FOR ANY PURPOSE, NOTIFY LAB     WITHIN 5 DAYS.                LOWEST DETECTABLE LIMITS     FOR URINE DRUG SCREEN     Drug Class       Cutoff (ng/mL)     Amphetamine      1000     Barbiturate      200     Benzodiazepine   200     Tricyclics       300     Opiates          300     Cocaine          300     THC              50  PREGNANCY, URINE     Status: None   Collection Time    10/09/13  7:50 PM      Result Value Range   Preg Test, Ur NEGATIVE  NEGATIVE   Comment:            THE SENSITIVITY OF THIS     METHODOLOGY IS >20 mIU/mL.  GLUCOSE, CAPILLARY     Status: Abnormal   Collection Time    10/09/13  9:47 PM      Result Value Range   Glucose-Capillary 232 (*) 70 - 99 mg/dL     Current Facility-Administered Medications  Medication Dose Route Frequency Provider Last  Rate Last Dose  . acetaminophen (TYLENOL) tablet 650 mg  650 mg Oral Q4H PRN Trixie Dredge, PA-C      . alum & mag hydroxide-simeth (MAALOX/MYLANTA) 200-200-20 MG/5ML suspension 30 mL  30 mL Oral PRN Trixie Dredge, PA-C      . citalopram (CELEXA) tablet 20 mg  20 mg Oral Daily Celene Kras, MD   20 mg at 10/10/13 0846  . glipiZIDE (GLUCOTROL) tablet 10 mg  10 mg Oral BID AC Celene Kras, MD   10 mg at 10/10/13 0845  . ibuprofen (ADVIL,MOTRIN) tablet 600 mg  600 mg Oral Q8H PRN Trixie Dredge, PA-C      . LORazepam (ATIVAN) tablet 1 mg  1 mg Oral Q8H PRN Trixie Dredge, PA-C      . metFORMIN (GLUCOPHAGE) tablet 500 mg  500 mg Oral BID WC Celene Kras, MD   500 mg at 10/10/13 0846  . nicotine (NICODERM CQ - dosed in mg/24 hours) patch 21 mg  21 mg Transdermal Daily Emily West, PA-C      . ondansetron [facility]) tablet 4 mg  4 mg Oral Q8H PRN Emily West, PA-C      . ondansetron (ZOFRAN-ODT) disintegrating tablet 4 mg  4 mg Oral Q8H PRN Celene Kras, MD      . silver sulfADIAZINE (SILVADENE) 1 % cream   Topical BID Emily West, PA-C      . zolpidem (AMBIEN) tablet 5 mg  5 mg Oral QHS PRN  Trixie Dredge, PA-C       Current Outpatient Prescriptions  Medication Sig Dispense Refill  . LORazepam (ATIVAN) 1 MG tablet Take 1 tablet (1 mg total) by mouth 2 (two) times daily as needed for anxiety.  20 tablet  0  . metFORMIN (GLUCOPHAGE) 500 MG tablet Take 1 tablet (500 mg total) by mouth 2 (two) times daily with a meal.  60 tablet  1  . ondansetron (ZOFRAN ODT) 4 MG disintegrating tablet Take 1 tablet (4 mg total) by mouth every 8 (eight) hours as needed for nausea or vomiting.  20 tablet  0  . citalopram (CELEXA) 20 MG tablet Take 1 tablet (20 mg total) by mouth daily.  30 tablet  3  . glipiZIDE (GLUCOTROL) 5 MG tablet Take 2 tablets (10 mg total) by mouth 2 (two) times daily before a meal.  60 tablet  1    Psychiatric Specialty Exam:     Blood pressure 135/83, pulse 92, temperature 98 F (36.7 C), temperature source Oral, resp. rate 20, last menstrual period 09/30/2013, SpO2 100.00%.There is no weight on file to calculate BMI.  General Appearance: Casual  Eye Contact::  Good  Speech:  Clear and Coherent and Normal Rate  Volume:  Normal  Mood:  Depressed  Affect:  Congruent and Depressed  Thought Process:  Circumstantial, Coherent, Goal Directed and Logical  Orientation:  Full (Time, Place, and Person)  Thought Content:  "I am fine just depressed about the situation"  Suicidal Thoughts:  No  Homicidal Thoughts:  No  Memory:  Immediate;   Good Recent;   Good Remote;   Good  Judgement:  Good  Insight:  Present  Psychomotor Activity:  Normal  Concentration:  Good  Recall:  Good  Akathisia:  No  Handed:  Right  AIMS (if indicated):     Assets:  Communication Skills Desire for Improvement Financial Resources/Insurance Housing Resilience Social Support Transportation  Sleep:      Treatment Plan Summary: Outpatient resources  Disposition:  Discharge home.  Give patient resources for outpatient services  Assunta Found, FNP-BC 10/10/2013 10:19 AM

## 2013-10-11 ENCOUNTER — Encounter: Payer: Self-pay | Admitting: Family Medicine

## 2013-10-13 ENCOUNTER — Ambulatory Visit (INDEPENDENT_AMBULATORY_CARE_PROVIDER_SITE_OTHER): Payer: Self-pay | Admitting: Family Medicine

## 2013-10-13 VITALS — BP 130/80 | HR 93 | Temp 99.3°F | Resp 16 | Ht 63.5 in | Wt 207.0 lb

## 2013-10-13 DIAGNOSIS — E1165 Type 2 diabetes mellitus with hyperglycemia: Principal | ICD-10-CM

## 2013-10-13 DIAGNOSIS — F324 Major depressive disorder, single episode, in partial remission: Secondary | ICD-10-CM

## 2013-10-13 DIAGNOSIS — R51 Headache: Secondary | ICD-10-CM

## 2013-10-13 DIAGNOSIS — IMO0002 Reserved for concepts with insufficient information to code with codable children: Secondary | ICD-10-CM

## 2013-10-13 DIAGNOSIS — IMO0001 Reserved for inherently not codable concepts without codable children: Secondary | ICD-10-CM

## 2013-10-13 LAB — GLUCOSE, POCT (MANUAL RESULT ENTRY): POC GLUCOSE: 269 mg/dL — AB (ref 70–99)

## 2013-10-13 MED ORDER — BLOOD GLUCOSE METER KIT: PACK | Status: AC

## 2013-10-13 MED ORDER — BLOOD GLUCOSE METER KIT: PACK | Status: DC

## 2013-10-13 NOTE — Progress Notes (Signed)
Subjective: Patient is here for followup regarding her hospital stay. She was kept overnight. They did not change her medications. She was seen back couple of psychiatrist and another Dr. She has had nausea a lot at nighttime. She has been taking the citalopram at nighttime. She does not have glucose meter yet. She does not get any exercise. She spent one night with her friend, then one night at her house. She said it was hard going back home.  Objective: Pleasant lady today. She is smiling and crying at times. She is talkative. She denies suicidal thoughts. We talked a little about her son.  Assessment: Diabetes poorly controlled Major depression, improving  Plan: Per instructions. Change the citalopram to morning. Only take half of a metformin twice daily. I will need to gradually do something differently with her diabetes, but she will give me to start for her sugars. Also urged her obstructive regular exercise and watch her diet.

## 2013-10-13 NOTE — Patient Instructions (Addendum)
Read the American Diabetic Association website.  Change the citalopram to taking it in the morning rather than at night  Change the metformin to one half tablet twice daily at breakfast and supper  Take the lorazepam as directed  Return in about 10 days, sooner if problems  Remember to watch your diet and to try to get regular exercise

## 2014-01-17 ENCOUNTER — Other Ambulatory Visit: Payer: Self-pay

## 2014-07-26 ENCOUNTER — Other Ambulatory Visit: Payer: Self-pay

## 2015-04-07 ENCOUNTER — Other Ambulatory Visit: Payer: Self-pay

## 2017-03-23 NOTE — Telephone Encounter (Signed)
error 

## 2020-09-26 ENCOUNTER — Ambulatory Visit: Payer: Medicaid Other | Attending: Internal Medicine

## 2020-09-26 DIAGNOSIS — Z23 Encounter for immunization: Secondary | ICD-10-CM

## 2020-09-26 NOTE — Progress Notes (Signed)
   Covid-19 Vaccination Clinic  Name:  April Smith    MRN: [id number] DOB: [birth year]  09/26/2020  [name] was observed post Covid-19 immunization for 15 minutes without incident. She was provided with Vaccine Information Sheet and instruction to access the V-Safe system.   [name] was instructed to call 911 with any severe reactions post vaccine: Marland Kitchen Difficulty breathing  . Swelling of face and throat  . A fast heartbeat  . A bad rash all over body  . Dizziness and weakness   Immunizations Administered    Name Date Dose VIS Date Route   Pfizer COVID-19 Vaccine 09/26/2020  4:03 PM 0.3 mL 07/30/2020 Intramuscular   Manufacturer: [employer], [employer]   Lot: [id number]   NDC: [address]-9
